# Patient Record
Sex: Male | Born: 1960 | Race: White | Hispanic: No | Marital: Married | Smoking: Current every day smoker
Health system: Southern US, Community
[De-identification: ages and names within clinical notes are randomized; demographics above are authoritative.]

## PROBLEM LIST (undated history)

## (undated) DIAGNOSIS — I10 Essential (primary) hypertension: Secondary | ICD-10-CM

## (undated) DIAGNOSIS — K7460 Unspecified cirrhosis of liver: Secondary | ICD-10-CM

## (undated) HISTORY — PX: TONSILLECTOMY: SUR1361

## (undated) HISTORY — DX: Unspecified cirrhosis of liver: K74.60

## (undated) HISTORY — PX: APPENDECTOMY: SHX54

## (undated) HISTORY — DX: Essential (primary) hypertension: I10

---

## 2006-11-08 ENCOUNTER — Ambulatory Visit: Payer: Self-pay | Admitting: Family Medicine

## 2006-11-08 DIAGNOSIS — R030 Elevated blood-pressure reading, without diagnosis of hypertension: Secondary | ICD-10-CM | POA: Insufficient documentation

## 2006-11-08 DIAGNOSIS — B3789 Other sites of candidiasis: Secondary | ICD-10-CM

## 2006-11-22 ENCOUNTER — Ambulatory Visit: Payer: Self-pay | Admitting: Family Medicine

## 2007-07-01 ENCOUNTER — Ambulatory Visit: Payer: Self-pay | Admitting: Family Medicine

## 2007-12-16 ENCOUNTER — Telehealth: Payer: Self-pay | Admitting: Family Medicine

## 2009-07-09 ENCOUNTER — Ambulatory Visit: Payer: Self-pay | Admitting: Family Medicine

## 2009-07-09 DIAGNOSIS — D179 Benign lipomatous neoplasm, unspecified: Secondary | ICD-10-CM | POA: Insufficient documentation

## 2009-07-13 ENCOUNTER — Encounter: Payer: Self-pay | Admitting: Family Medicine

## 2010-11-01 ENCOUNTER — Ambulatory Visit: Admit: 2010-11-01 | Payer: Self-pay | Admitting: Family Medicine

## 2011-12-19 ENCOUNTER — Emergency Department
Admit: 2011-12-19 | Discharge: 2011-12-19 | Disposition: A | Discharge status: Home / Self Care | Payer: BC Managed Care – PPO

## 2011-12-19 ENCOUNTER — Emergency Department
Admission: EM | Admit: 2011-12-19 | Discharge: 2011-12-19 | Disposition: A | Discharge status: Home Health | Payer: BC Managed Care – PPO | Source: Home / Self Care | Attending: Family Medicine | Admitting: Family Medicine

## 2011-12-19 DIAGNOSIS — R0602 Shortness of breath: Secondary | ICD-10-CM

## 2011-12-19 DIAGNOSIS — J069 Acute upper respiratory infection, unspecified: Secondary | ICD-10-CM

## 2011-12-19 DIAGNOSIS — F17200 Nicotine dependence, unspecified, uncomplicated: Secondary | ICD-10-CM

## 2011-12-19 DIAGNOSIS — R062 Wheezing: Secondary | ICD-10-CM

## 2011-12-19 MED ORDER — BENZONATATE 200 MG PO CAPS: 200.0000 mg | ORAL_CAPSULE | Freq: Every day | ORAL | Status: AC

## 2011-12-19 MED ORDER — AZITHROMYCIN 250 MG PO TABS: ORAL_TABLET | ORAL | Status: AC

## 2011-12-19 NOTE — ED Provider Notes (Signed)
History     CSN: [id number]  Arrival date & time 12/19/11  1826   First MD Initiated Contact with Patient 12/19/11 1910      Chief Complaint  Patient presents with  . Cough      HPI Comments: Patient complains of awakening today with a sore throat, nasal drainage, myalgias, cough, and upper mid-back pain when coughing.  Complains of fatigue.  Cough is non-productive and he complains of shortness of breath with activity.     The history is provided by the patient.    History reviewed. No pertinent past medical history.  History reviewed. No pertinent past surgical history.  Family History:  No pertinent family history.  History  Substance Use Topics  . Smoking status: Current Everyday Smoker  . Smokeless tobacco: Not on file  . Alcohol Use: 14.4 oz/week    24 Cans of beer per week      Review of Systems + sore throat + cough ? pleuritic pain, + upper mid-back pain with cough + wheezing + nasal congestion ? post-nasal drainage No sinus pain/pressure No itchy/red eyes No earache No hemoptysis + SOB No fever, + chills No nausea No vomiting No abdominal pain No diarrhea No urinary symptoms No skin rashes + fatigue + myalgias No headache Used OTC meds without relief  Allergies  Penicillins  Home Medications   Current Outpatient Rx  Name Route Sig Dispense Refill  . AZITHROMYCIN 250 MG PO TABS  Take 2 tabs today; then begin one tab once daily for 4 more days. 6 each 0  . BENZONATATE 200 MG PO CAPS Oral Take 1 capsule (200 mg total) by mouth at bedtime. Take as needed for cough 12 capsule 0    BP 154/97  Pulse 99  Temp(Src) 98.8 F (37.1 C) (Oral)  Resp 18  Ht 5\' 11"  (1.803 m)  Wt 180 lb (81.647 kg)  BMI 25.10 kg/m2  SpO2 96%  Physical Exam Nursing notes and Vital Signs reviewed. Appearance:  Patient appears cachectic, older than stated age, and in no acute distress.  He smells of alcohol but is alert and oriented. Eyes:  Pupils are equal,  round, and reactive to light and accomodation.  Extraocular movement is intact.  Conjunctivae are not inflamed  Ears:  Canals normal.  Tympanic membranes normal.  Nose:  Mildly congested turbinates.  No sinus tenderness.    Pharynx:  Normal Neck:  Supple.  Slightly tender shotty posterior nodes are palpated bilaterally  Lungs:  Bilateral wheezes posteriorly.  Breath sounds are equal but tubular in character.  Heart:  Regular rate and rhythm without murmurs, rubs, or gallops.  Abdomen:  Nontender without masses or hepatosplenomegaly.  Bowel sounds are present.  No CVA or flank tenderness.  Extremities:  No edema.  No calf tenderness Skin:  No rash present.  Right upper back reveals presence of a large nontender lipoma about 4cm dia. Back:  No upper back tenderness to palpation.  ED Course  Procedures none  Labs Reviewed - No data to display Dg Chest 2 View  12/19/2011  *RADIOLOGY REPORT*  Clinical Data: Cough, pleuritic posterior chest pain  CHEST - 2 VIEW  Comparison: None.  Findings:  The examination is minimally degraded secondary to technique (overexposure).  Normal cardiac silhouette.  There is mild prominence of the ascending thoracic aorta. The lungs are hyperinflated.  No focal airspace opacities.  No definite pleural effusion or pneumothorax.  Age indeterminate mild (under 25%) anterior compression deformity of a lower  thoracic/lumbar vertebral bodies.  IMPRESSION: 1.  Hyperexpanded lungs without acute cardiopulmonary disease. 2.  Mild prominence of the ascending thoracic aorta.  Further evaluation with cardiac echo may be obtained if clinically indicated. 3.  Age indeterminate mild compression deformity of lower thoracic/upper lumbar vertebral bodies.  Correlation for point tenderness at these locations is recommended.  Original Report Authenticated By: Waynard Reeds, M.D.   See above note:  No tenderness palpated at site of concern.  Patient states that he has had trauma to his back in  the past.  1. Acute upper respiratory infections of unspecified site       MDM  Suspect early viral URI.  Patient at risk for secondary bacterial infection. Begin Z-pack and Tessalon at bedtime. Take plain Mucinex (guaifenesin) twice daily for cough and congestion.  Increase fluid intake, rest. May use Afrin nasal spray (or generic oxymetazoline) twice daily for about 5 days.  Also recommend using saline nasal spray several times daily and saline nasal irrigation (AYR is a common brand) Stop all antihistamines for now, and other non-prescription cough/cold preparations. May take Ibuprofen 200mg , 4 tabs every 8 hours with food for chest/sternum discomfort. If symptoms become significantly worse during the night proceed to the local emergency room.  Follow-up with family doctor if not improving 7 to 10 days.          Donna Christen, MD 12/20/11 1137

## 2011-12-19 NOTE — Discharge Instructions (Signed)
Take plain Mucinex (guaifenesin) twice daily for cough and congestion.  Increase fluid intake, rest. May use Afrin nasal spray (or generic oxymetazoline) twice daily for about 5 days.  Also recommend using saline nasal spray several times daily and saline nasal irrigation (AYR is a common brand) Stop all antihistamines for now, and other non-prescription cough/cold preparations. May take Ibuprofen 200mg , 4 tabs every 8 hours with food for chest/sternum discomfort. Follow-up with family doctor if not improving 7 to 10 days.

## 2011-12-19 NOTE — ED Notes (Signed)
Pt c/o non productive cough onset this am.  Pt denies fever.  Pt states he has been using ETOH for pain in back. Pt denies injury.  Pt denies N/V/diaphoresis.

## 2011-12-20 ENCOUNTER — Ambulatory Visit: Payer: Self-pay | Admitting: Family Medicine

## 2011-12-20 ENCOUNTER — Telehealth: Payer: Self-pay | Admitting: *Deleted

## 2012-08-29 ENCOUNTER — Ambulatory Visit (INDEPENDENT_AMBULATORY_CARE_PROVIDER_SITE_OTHER): Payer: BC Managed Care – PPO | Admitting: Family Medicine

## 2012-08-29 ENCOUNTER — Encounter: Payer: Self-pay | Admitting: Family Medicine

## 2012-08-29 VITALS — BP 139/80 | HR 80 | Ht 71.0 in | Wt 165.0 lb

## 2012-08-29 DIAGNOSIS — Z23 Encounter for immunization: Secondary | ICD-10-CM

## 2012-08-29 DIAGNOSIS — Z0000 Encounter for general adult medical examination without abnormal findings: Secondary | ICD-10-CM

## 2012-08-29 DIAGNOSIS — L408 Other psoriasis: Secondary | ICD-10-CM

## 2012-08-29 DIAGNOSIS — L409 Psoriasis, unspecified: Secondary | ICD-10-CM

## 2012-08-29 LAB — COMPLETE METABOLIC PANEL WITH GFR
BUN: 4 mg/dL — ABNORMAL LOW (ref 6–23)
CO2: 28 mEq/L (ref 19–32)
Creat: 0.61 mg/dL (ref 0.50–1.35)
GFR, Est African American: >89 mL/min
GFR, Est Non African American: >89 mL/min
Glucose, Bld: 81 mg/dL (ref 70–99)
Total Bilirubin: 0.8 mg/dL (ref 0.3–1.2)
Total Protein: 7.5 g/dL (ref 6.0–8.3)

## 2012-08-29 LAB — LIPID PANEL
Cholesterol: 190 mg/dL (ref 0–200)
HDL: 75 mg/dL (ref >39)
Triglycerides: 57 mg/dL (ref <150)

## 2012-08-29 NOTE — Addendum Note (Signed)
Addended by: Judie Petit A on: 08/29/2012 01:14 PM   Modules accepted: Orders

## 2012-08-29 NOTE — Progress Notes (Signed)
Subjective:    Patient ID: Derek Lopez, male    DOB: [birth year], 51 y.o.   MRN: [id number]  HPI Here for CPE today. No specific complaints. Declines colonscopy but says wil thinkg about it. Declines flu shot.  He also declined to remove his clothing and put on a gown for his exam today. He says the only reason he is here today is because his wife made him come.    Review of Systems comprehensiv eROS is negative.     BP 139/80  Pulse 80  Ht 5\' 11"  (1.803 m)  Wt 165 lb (74.844 kg)  BMI 23.01 kg/m2    Allergies  Allergen Reactions  . Penicillins     REACTION: rash    History reviewed. No pertinent past medical history.  Past Surgical History  Procedure Date  . Appendectomy   . Tonsillectomy     History   Social History  . Marital Status: Single    Spouse Name: N/A    Number of Children: 2  . Years of Education: N/A   Occupational History  . Retired.     Social History Main Topics  . Smoking status: Current Every Day Smoker -- 1.0 packs/day    Types: Cigarettes  . Smokeless tobacco: Not on file  . Alcohol Use: 14.4 oz/week    24 Cans of beer per week  . Drug Use: No  . Sexually Active: Yes -- Male partner(s)   Other Topics Concern  . Not on file   Social History Narrative   No regular exercise but says he is active.      Family History  Problem Relation Age of Onset  . Cancer Mother     No outpatient encounter prescriptions on file as of 08/29/2012.       Objective:   Physical Exam  Constitutional: He is oriented to person, place, and time. He appears well-developed and well-nourished.  HENT:  Head: Normocephalic and atraumatic.  Right Ear: External ear normal.  Left Ear: External ear normal.  Nose: Nose normal.  Mouth/Throat: Oropharynx is clear and moist.  Eyes: Conjunctivae normal and EOM are normal. Pupils are equal, round, and reactive to light.  Neck: Normal range of motion. Neck supple. No thyromegaly present.  Cardiovascular:  Normal rate, regular rhythm, normal heart sounds and intact distal pulses.        No carotid ro abdominal bruits.   Pulmonary/Chest: Effort normal and breath sounds normal.  Abdominal: Soft. Bowel sounds are normal. He exhibits no distension and no mass. There is no tenderness. There is no rebound and no guarding.  Genitourinary:       Declined prostate exam.    Musculoskeletal: Normal range of motion.  Lymphadenopathy:    He has no cervical adenopathy.  Neurological: He is alert and oriented to person, place, and time. He has normal reflexes.  Skin: Skin is warm and dry.       Psoriatic plaques on her lower extremities.   Psychiatric: He has a normal mood and affect. His behavior is normal. Judgment and thought content normal.          Assessment & Plan:  CPE Keep up a regular exercise program and make sure you are eating a healthy diet Try to eat 4 servings of dairy a day, or if you are lactose intolerant take a calcium with vitamin D daily.  Your vaccines are up to date.  Due for CMP, lipids, PSA  He declined colon cancer screening but did  say he would at least do stool cards. He said he would think about the colonoscopy. Given stool cards samples today.  Tdap given today. He declined a flu vaccine.  Blood pressure is borderline. We'll keep an eye on this.

## 2012-08-29 NOTE — Patient Instructions (Addendum)
Keep up a regular exercise program and make sure you are eating a healthy diet Try to eat 4 servings of dairy a day, or if you are lactose intolerant take a calcium with vitamin D daily.  Your vaccines are up to date.  Please think about getting her colonoscopy.

## 2012-09-02 NOTE — Addendum Note (Signed)
Addended by: Ellsworth Lennox on: 09/02/2012 11:21 AM   Modules accepted: Orders

## 2012-09-03 LAB — HEPATITIS PANEL, ACUTE
HCV Ab: NEGATIVE
Hepatitis B Surface Ag: NEGATIVE

## 2013-08-06 ENCOUNTER — Ambulatory Visit: Payer: BC Managed Care – PPO | Admitting: Family Medicine

## 2013-10-28 ENCOUNTER — Encounter: Payer: Self-pay | Admitting: Family Medicine

## 2013-10-28 ENCOUNTER — Ambulatory Visit (INDEPENDENT_AMBULATORY_CARE_PROVIDER_SITE_OTHER): Payer: 59 | Admitting: Family Medicine

## 2013-10-28 VITALS — BP 137/85 | HR 89 | Temp 97.4°F | Wt 157.0 lb

## 2013-10-28 DIAGNOSIS — L089 Local infection of the skin and subcutaneous tissue, unspecified: Secondary | ICD-10-CM

## 2013-10-28 DIAGNOSIS — L723 Sebaceous cyst: Secondary | ICD-10-CM

## 2013-10-28 MED ORDER — SULFAMETHOXAZOLE-TMP DS 800-160 MG PO TABS: 1.0000 | ORAL_TABLET | Freq: Two times a day (BID) | ORAL | Status: DC

## 2013-10-28 NOTE — Progress Notes (Signed)
   Subjective:    Patient ID: Derek Lopez, male    DOB: [birth year], 53 y.o.   MRN: [id number]  HPI  Infected sebaceous cyst - he says it has been there for years. Here is having it as a child. He said this is the third time over his lifetime that it's become infected and burst. It opened up on Saturday and started draining a large amount of pus. He denies any fevers chills or sweats. His wife says she's been putting gauze on it and it still continued to drain. They actually are to have this appointment scheduled.   Review of Systems     Objective:   Physical Exam  Constitutional: He appears well-developed and well-nourished.  HENT:  Head: Normocephalic and atraumatic.  Skin: Skin is warm and dry.  Right mid back with approx 6-7 cm when probed.  approx 1 cm surrounding erythema at the opening.   Psychiatric: He has a normal mood and affect. His behavior is normal.          Assessment & Plan:  Infected sebaceous cyst-area was numbed and opened up further to improve drainage. Will place him on Keflex as he does have an approximately 1 cm ring of erythema. Will schedule with dermatology for further excision. Because the cyst itself is approximately 6-7 cm large I would feel more comfortable if dermatology or Gen. surgery actually removed the cyst.  Incision and Drainage Procedure Note  Pre-operative Diagnosis: infected seb cyst  Post-operative Diagnosis: same  Indications: infection and pain  Anesthesia: 1% lidocaine with epinephrine  Procedure Details  The procedure, risks and complications have been discussed in detail (including, but not limited to airway compromise, infection, bleeding) with the patient, and the patient has signed consent to the procedure.  The skin was sterilely prepped and draped over the affected area in the usual fashion. After adequate local anesthesia, I&D with a #11 blade was performed on the cyst . Purulent drainage: present. Wound was probed  with sterile applicator. Lesion approx 6- 7 cm in size. No tracks.  The patient was observed until stable.  Findings: pus  EBL: 0.5 cc's  Drains: gauze placed  Condition: Tolerated procedure well   Complications: none.

## 2014-09-24 ENCOUNTER — Encounter: Payer: Self-pay | Admitting: Family Medicine

## 2014-09-24 ENCOUNTER — Ambulatory Visit (INDEPENDENT_AMBULATORY_CARE_PROVIDER_SITE_OTHER): Payer: 59 | Admitting: Family Medicine

## 2014-09-24 ENCOUNTER — Ambulatory Visit (INDEPENDENT_AMBULATORY_CARE_PROVIDER_SITE_OTHER): Payer: 59 | Admitting: *Deleted

## 2014-09-24 VITALS — BP 145/92 | HR 92 | Temp 97.7°F | Wt 164.0 lb

## 2014-09-24 DIAGNOSIS — Z23 Encounter for immunization: Secondary | ICD-10-CM

## 2014-09-24 DIAGNOSIS — G5701 Lesion of sciatic nerve, right lower limb: Secondary | ICD-10-CM

## 2014-09-24 DIAGNOSIS — Z0000 Encounter for general adult medical examination without abnormal findings: Secondary | ICD-10-CM

## 2014-09-24 DIAGNOSIS — F1090 Alcohol use, unspecified, uncomplicated: Secondary | ICD-10-CM

## 2014-09-24 DIAGNOSIS — Z789 Other specified health status: Secondary | ICD-10-CM

## 2014-09-24 NOTE — Patient Instructions (Signed)
complete physical examination Keep up a regular exercise program and make sure you are eating a healthy diet Try to eat 4 servings of dairy a day, or if you are lactose intolerant take a calcium with vitamin D daily.  Your vaccines are up to date.   

## 2014-09-24 NOTE — Progress Notes (Signed)
   Subjective:    Patient ID: Derek Lopez, male    DOB: 1961-02-10, 53 y.o.   MRN: [id number]  HPI  Here for CPE.  No urinary sxs.  No real exercise but says walks a lot.  Heavy alcohol use.    Pain in the right posterior hip. Has been going on for years.  Say had fallen on that hip about 3 times over the last 3 years.  Occ has pain that radiates down the right leg all the way to his foot. Sometimes it causes him to limp and sometimes has difficulty walking to the grocery store with it. At other times it seems better. He denies any low back pain or injury.  He says he drinks 8-10 beers per day.  His wife is here with him today.   Review of Systems     Objective:   Physical Exam  Constitutional: He is oriented to person, place, and time. He appears well-developed and well-nourished.  HENT:  Head: Normocephalic and atraumatic.  Right Ear: External ear normal.  Left Ear: External ear normal.  Nose: Nose normal.  Mouth/Throat: Oropharynx is clear and moist.  TMs and canals are clear.   Eyes: Conjunctivae and EOM are normal. Pupils are equal, round, and reactive to light.  Neck: Normal range of motion. Neck supple. No thyromegaly present.  Cardiovascular: Normal rate, regular rhythm, normal heart sounds and intact distal pulses.   Pulmonary/Chest: Effort normal and breath sounds normal.  Abdominal: Soft. Bowel sounds are normal. He exhibits no distension and no mass. There is no tenderness. There is no rebound and no guarding.  Musculoskeletal: Normal range of motion.  Lymphadenopathy:    He has no cervical adenopathy.  Neurological: He is alert and oriented to person, place, and time. He has normal reflexes.  Skin: Skin is warm and dry.  Psychiatric: He has a normal mood and affect. His behavior is normal. Judgment and thought content normal.    Nontender over the lumbar spine. Right Hip, knee, ankle strength and range of motion is normal and symmetric. He's tender over the mid  back area. Nontender over the SI joint. No significant discomfort pain with right hip internal or external rotation.      Assessment & Plan:  CPE -  Try to eat 4 servings of dairy a day, or if you are lactose intolerant take a calcium with vitamin D daily.  Your vaccines are up to date.  Discussed need for colonoscopy. Flu shot given today. Encouraged him to check with his insurance to see if they have coverage for the shingles vaccine for 50 and over.  Right piriformis syndrome - discussed dx. Work on stretches and exercise and call if not better in one month.    Heavy alcohol use - Discourage drinking.  Encourage him to at least cut back.

## 2014-09-25 LAB — LIPID PANEL
CHOL/HDL RATIO: 2.8 ratio
CHOLESTEROL: 215 mg/dL — AB (ref 0–200)
HDL: 76 mg/dL (ref >39)
LDL Cholesterol: 121 mg/dL — ABNORMAL HIGH (ref 0–99)
Triglycerides: 89 mg/dL (ref <150)
VLDL: 18 mg/dL (ref 0–40)

## 2014-09-25 LAB — COMPLETE METABOLIC PANEL WITH GFR
ALBUMIN: 4.5 g/dL (ref 3.5–5.2)
ALK PHOS: 114 U/L (ref 39–117)
ALT: 38 U/L (ref 0–53)
AST: 84 U/L — ABNORMAL HIGH (ref 0–37)
BILIRUBIN TOTAL: 1.1 mg/dL (ref 0.2–1.2)
BUN: 4 mg/dL — AB (ref 6–23)
CO2: 27 meq/L (ref 19–32)
Calcium: 9.3 mg/dL (ref 8.4–10.5)
Chloride: 91 mEq/L — ABNORMAL LOW (ref 96–112)
Creat: 0.58 mg/dL (ref 0.50–1.35)
GLUCOSE: 103 mg/dL — AB (ref 70–99)
POTASSIUM: 4.2 meq/L (ref 3.5–5.3)
Sodium: 128 mEq/L — ABNORMAL LOW (ref 135–145)
TOTAL PROTEIN: 8.1 g/dL (ref 6.0–8.3)

## 2014-09-25 LAB — PSA: PSA: 3.62 ng/mL (ref <=4.00)

## 2014-09-28 ENCOUNTER — Other Ambulatory Visit: Payer: Self-pay | Admitting: Family Medicine

## 2014-09-28 DIAGNOSIS — R748 Abnormal levels of other serum enzymes: Secondary | ICD-10-CM

## 2014-09-28 DIAGNOSIS — Z789 Other specified health status: Secondary | ICD-10-CM

## 2014-09-28 DIAGNOSIS — E871 Hypo-osmolality and hyponatremia: Secondary | ICD-10-CM

## 2014-09-29 ENCOUNTER — Other Ambulatory Visit: Payer: Self-pay | Admitting: *Deleted

## 2014-09-29 ENCOUNTER — Encounter: Payer: Self-pay | Admitting: *Deleted

## 2014-09-29 DIAGNOSIS — E871 Hypo-osmolality and hyponatremia: Secondary | ICD-10-CM

## 2014-09-30 ENCOUNTER — Telehealth: Payer: Self-pay | Admitting: *Deleted

## 2014-09-30 NOTE — Telephone Encounter (Signed)
Spoke w/pt's wife and informed her of pt needing to have a CXR done and [place] of liver. She stated that she will bring him in by the end of the week. I also told her that someone should be contacting them w/regards to the [place].Elouise Munroe'

## 2014-10-01 ENCOUNTER — Ambulatory Visit (INDEPENDENT_AMBULATORY_CARE_PROVIDER_SITE_OTHER): Payer: 59

## 2014-10-01 ENCOUNTER — Encounter: Payer: Self-pay | Admitting: Family Medicine

## 2014-10-01 DIAGNOSIS — I700 Atherosclerosis of aorta: Secondary | ICD-10-CM | POA: Insufficient documentation

## 2014-10-01 DIAGNOSIS — Z87891 Personal history of nicotine dependence: Secondary | ICD-10-CM

## 2014-10-01 DIAGNOSIS — E871 Hypo-osmolality and hyponatremia: Secondary | ICD-10-CM

## 2014-10-02 ENCOUNTER — Ambulatory Visit (INDEPENDENT_AMBULATORY_CARE_PROVIDER_SITE_OTHER): Payer: 59

## 2014-10-02 ENCOUNTER — Other Ambulatory Visit: Payer: Self-pay | Admitting: Family Medicine

## 2014-10-02 DIAGNOSIS — K7030 Alcoholic cirrhosis of liver without ascites: Secondary | ICD-10-CM

## 2014-10-02 DIAGNOSIS — Z789 Other specified health status: Secondary | ICD-10-CM

## 2014-10-02 DIAGNOSIS — F1099 Alcohol use, unspecified with unspecified alcohol-induced disorder: Secondary | ICD-10-CM

## 2014-10-02 DIAGNOSIS — R748 Abnormal levels of other serum enzymes: Secondary | ICD-10-CM

## 2014-10-05 ENCOUNTER — Other Ambulatory Visit: Payer: Self-pay | Admitting: *Deleted

## 2014-10-05 DIAGNOSIS — E871 Hypo-osmolality and hyponatremia: Secondary | ICD-10-CM

## 2014-11-04 ENCOUNTER — Telehealth: Payer: Self-pay | Admitting: *Deleted

## 2014-11-04 DIAGNOSIS — K869 Disease of pancreas, unspecified: Secondary | ICD-10-CM

## 2014-11-04 NOTE — Telephone Encounter (Signed)
Pt's wife informed that he will have to be seen by this specialist no one in [place]. Also is ok with having ct done.Derek Lopez

## 2014-11-04 NOTE — Telephone Encounter (Signed)
Pt's wife called and stated that he received a letter in the mail from [place] [facility] in [place]. She said that she would like get this appt scheduled for [facility] if possible.

## 2014-11-05 NOTE — Telephone Encounter (Signed)
Order placed. Couldn't find one specific for pancreas.

## 2014-11-09 ENCOUNTER — Telehealth: Payer: Self-pay | Admitting: *Deleted

## 2014-11-09 ENCOUNTER — Other Ambulatory Visit: Payer: Self-pay | Admitting: Family Medicine

## 2014-11-09 ENCOUNTER — Telehealth: Payer: Self-pay

## 2014-11-09 DIAGNOSIS — K869 Disease of pancreas, unspecified: Secondary | ICD-10-CM

## 2014-11-09 NOTE — Telephone Encounter (Signed)
No PA is required for CT of abdomen with and without contrast.

## 2014-11-09 NOTE — Progress Notes (Signed)
Imaging called and CT needs to be changed to with and without contrast. New order entered for follow-up lesion of the pancreas.

## 2014-11-09 NOTE — Telephone Encounter (Signed)
Pt's wife informed that CT will be done here in [place].Audelia Hives [place]

## 2014-12-16 ENCOUNTER — Telehealth: Payer: Self-pay | Admitting: Family Medicine

## 2014-12-16 NOTE — Telephone Encounter (Signed)
Call pt: received notes from hepatologist adn they want [place] to further evaluation for his low sodium If he is ok , we usually do extra labs for this. Let me know.

## 2014-12-17 NOTE — Telephone Encounter (Signed)
Pt's wife informed and is ok with this .Derek Lopez

## 2014-12-22 NOTE — Telephone Encounter (Signed)
Blood work was ordered in December. He can go anytime

## 2015-02-25 ENCOUNTER — Other Ambulatory Visit: Payer: Self-pay | Admitting: Nurse Practitioner

## 2015-02-25 DIAGNOSIS — C220 Liver cell carcinoma: Secondary | ICD-10-CM

## 2015-02-26 ENCOUNTER — Encounter: Payer: Self-pay | Admitting: Physician Assistant

## 2015-02-26 ENCOUNTER — Ambulatory Visit (INDEPENDENT_AMBULATORY_CARE_PROVIDER_SITE_OTHER): Payer: 59 | Admitting: Physician Assistant

## 2015-02-26 VITALS — BP 140/100 | HR 96 | Wt 170.0 lb

## 2015-02-26 DIAGNOSIS — Z789 Other specified health status: Secondary | ICD-10-CM | POA: Diagnosis not present

## 2015-02-26 DIAGNOSIS — R000 Tachycardia, unspecified: Secondary | ICD-10-CM

## 2015-02-26 DIAGNOSIS — F1090 Alcohol use, unspecified, uncomplicated: Secondary | ICD-10-CM

## 2015-02-26 DIAGNOSIS — I10 Essential (primary) hypertension: Secondary | ICD-10-CM

## 2015-02-26 DIAGNOSIS — E871 Hypo-osmolality and hyponatremia: Secondary | ICD-10-CM | POA: Diagnosis not present

## 2015-02-26 DIAGNOSIS — IMO0001 Reserved for inherently not codable concepts without codable children: Secondary | ICD-10-CM | POA: Insufficient documentation

## 2015-02-26 DIAGNOSIS — F1020 Alcohol dependence, uncomplicated: Secondary | ICD-10-CM | POA: Insufficient documentation

## 2015-02-26 MED ORDER — METOPROLOL SUCCINATE ER 50 MG PO TB24: 50.0000 mg | ORAL_TABLET | Freq: Every day | ORAL | Status: DC

## 2015-02-26 NOTE — Progress Notes (Signed)
   Subjective:    Patient ID: Derek Lopez, male    DOB: 05-Jul-1961, 54 y.o.   MRN: [id number]  HPI Pt presents to the clinic with wife for elevated blood pressure readings. Patient admits to a history of smoking and drinking alcohol daily. He drinks about 8-10 beers a day. Dr. Charise Carwin had sent him to a gastroenterologist to follow his liver enzymes due to alcohol use. His blood pressures at his gastroenterologists have been elevated as well as when his wife checks his blood pressures they've been in the 150s-160s/90s-100. He denies any chest pain, palpitations, vision changes. He does have some headaches. He also does have some shortness of breath with exertion. He has never been on anything for blood pressure. His last labs his sodium was decreased. He did have a chest x-ray that was negative for any malignancy. He has had no other blood work done.  Review of Systems  All other systems reviewed and are negative.      Objective:   Physical Exam  Constitutional: He is oriented to person, place, and time. He appears well-developed and well-nourished.  HENT:  Head: Normocephalic and atraumatic.  Cardiovascular: Normal rate, regular rhythm and normal heart sounds.   Pulmonary/Chest: Effort normal and breath sounds normal.  Neurological: He is alert and oriented to person, place, and time.  Skin: Skin is dry.  Psychiatric: He has a normal mood and affect. His behavior is normal.          Assessment & Plan:  Hypertension/tachycardia- ACE and ARB to use in caution with hyponatermia; therefore, I did not want to prescribe that even though first-line therapy. Norvasc caution with aortic artherosclerosis I do see this on patient's med list.. Will try metoprolol 50 mg once daily. Discussed side effects of beta blockers. Encouraged patient to follow-up in the next 2-3 weeks with his PCP Dr. Charise Carwin.  Hyponatremia-labs were ordered to further evaluate hyponatremia. Certainly he was not on any  medications that should be causing this. Chest x-ray was normal. Will check a random glucose as well.. I did inadvertently forget to order a thyroid as well. If not been checked will add when labs come back.

## 2015-02-27 LAB — OSMOLALITY: Osmolality: 316 mOsm/kg — ABNORMAL HIGH (ref 275–300)

## 2015-02-27 LAB — GLUCOSE, RANDOM: GLUCOSE: 108 mg/dL — AB (ref 70–99)

## 2015-02-27 LAB — SODIUM, URINE, RANDOM: Sodium, Ur: 29 mEq/L

## 2015-02-27 LAB — OSMOLALITY, URINE: Osmolality, Ur: 217 mOsm/kg — ABNORMAL LOW (ref 390–1090)

## 2015-03-03 ENCOUNTER — Other Ambulatory Visit: Payer: Self-pay | Admitting: Family Medicine

## 2015-03-03 DIAGNOSIS — E871 Hypo-osmolality and hyponatremia: Secondary | ICD-10-CM

## 2015-03-23 ENCOUNTER — Other Ambulatory Visit: Payer: Self-pay | Admitting: Family Medicine

## 2015-03-23 MED ORDER — METOPROLOL SUCCINATE ER 50 MG PO TB24: 50.0000 mg | ORAL_TABLET | Freq: Every day | ORAL | Status: DC

## 2015-04-01 ENCOUNTER — Ambulatory Visit (INDEPENDENT_AMBULATORY_CARE_PROVIDER_SITE_OTHER): Payer: 59 | Admitting: Family Medicine

## 2015-04-01 ENCOUNTER — Encounter: Payer: Self-pay | Admitting: Family Medicine

## 2015-04-01 VITALS — BP 122/84 | HR 88 | Wt 166.0 lb

## 2015-04-01 DIAGNOSIS — R062 Wheezing: Secondary | ICD-10-CM

## 2015-04-01 DIAGNOSIS — I10 Essential (primary) hypertension: Secondary | ICD-10-CM | POA: Diagnosis not present

## 2015-04-01 DIAGNOSIS — E871 Hypo-osmolality and hyponatremia: Secondary | ICD-10-CM

## 2015-04-01 MED ORDER — METOPROLOL SUCCINATE ER 50 MG PO TB24: 50.0000 mg | ORAL_TABLET | Freq: Two times a day (BID) | ORAL | Status: DC

## 2015-04-01 MED ORDER — ALBUTEROL SULFATE (2.5 MG/3ML) 0.083% IN NEBU
2.5000 mg | INHALATION_SOLUTION | Freq: Once | RESPIRATORY_TRACT | Status: AC
  Administered 2015-04-01: 2.5 mg via RESPIRATORY_TRACT

## 2015-04-01 MED ORDER — IPRATROPIUM-ALBUTEROL 0.5-2.5 (3) MG/3ML IN SOLN
3.0000 mL | Freq: Once | RESPIRATORY_TRACT | Status: AC
  Administered 2015-04-01: 3 mL via RESPIRATORY_TRACT

## 2015-04-01 NOTE — Progress Notes (Signed)
   Subjective:    Patient ID: Derek Lopez, male    DOB: 27-Jun-1961, 54 y.o.   MRN: [id number]  HPI Hypertension- Pt denies chest pain, SOB, dizziness, or heart palpitations.  Taking meds as directed w/o problems.  Denies medication side effects.  He also brought in a log of blood pressures area over the last 3 weeks. The best blood pressure was 125/90. The highest was 168/105.    He denies any SOB or chest pain  His wife has questions about the low sodium and the labs we found.     Review of Systems     Objective:   Physical Exam  Constitutional: He is oriented to person, place, and time. He appears well-developed and well-nourished.  HENT:  Head: Normocephalic and atraumatic.  Cardiovascular: Normal rate, regular rhythm and normal heart sounds.   Pulmonary/Chest: Effort normal. He has wheezes.  Diffuse expiratory wheezing. No in [place] wheezing.  Neurological: He is alert and oriented to person, place, and time.  Skin: Skin is warm and dry.  Psychiatric: He has a normal mood and affect. His behavior is normal.          Assessment & Plan:  HTN - he is tolerating the beta blocker well without any side effects so far. Will increase dose to 100 mg. New per prescription and sent to pharmacy. Follow-up in one month. Continue to log blood pressures at home and bring the record in with him.  Wheezing-peak flows performed. He says he does have allergies. He denies any history of asthma as a child, but was on allergy shots for quite some time. Peak flow in the red zone. We gave nebulizer treatment. Repeat still in the red zone. Second nebulizer treatment given. Repeat did make some improvement but still technically in the red zone. Pulse ox is 97%.F/U for spirometry.  He says he feels fine today.   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 We discussed this further today and they  are okay with doing so. Hyponatremia.

## 2015-04-20 ENCOUNTER — Encounter: Payer: Self-pay | Admitting: Nurse Practitioner

## 2015-04-30 ENCOUNTER — Ambulatory Visit (INDEPENDENT_AMBULATORY_CARE_PROVIDER_SITE_OTHER): Payer: 59 | Admitting: Family Medicine

## 2015-04-30 ENCOUNTER — Encounter: Payer: Self-pay | Admitting: Family Medicine

## 2015-04-30 VITALS — BP 141/84 | HR 73 | Ht 71.0 in | Wt 168.0 lb

## 2015-04-30 DIAGNOSIS — F17200 Nicotine dependence, unspecified, uncomplicated: Secondary | ICD-10-CM

## 2015-04-30 DIAGNOSIS — J4540 Moderate persistent asthma, uncomplicated: Secondary | ICD-10-CM

## 2015-04-30 DIAGNOSIS — Z720 Tobacco use: Secondary | ICD-10-CM | POA: Diagnosis not present

## 2015-04-30 DIAGNOSIS — R062 Wheezing: Secondary | ICD-10-CM

## 2015-04-30 DIAGNOSIS — J410 Simple chronic bronchitis: Secondary | ICD-10-CM

## 2015-04-30 DIAGNOSIS — J45909 Unspecified asthma, uncomplicated: Secondary | ICD-10-CM | POA: Insufficient documentation

## 2015-04-30 MED ORDER — ALBUTEROL SULFATE HFA 108 (90 BASE) MCG/ACT IN AERS: 2.0000 | INHALATION_SPRAY | Freq: Four times a day (QID) | RESPIRATORY_TRACT | Status: DC | PRN

## 2015-04-30 MED ORDER — ALBUTEROL SULFATE (2.5 MG/3ML) 0.083% IN NEBU
2.5000 mg | INHALATION_SOLUTION | Freq: Once | RESPIRATORY_TRACT | Status: AC
  Administered 2015-04-30: 2.5 mg via RESPIRATORY_TRACT

## 2015-04-30 MED ORDER — FLUTICASONE FUROATE-VILANTEROL 100-25 MCG/INH IN AEPB: 1.0000 | INHALATION_SPRAY | Freq: Every day | RESPIRATORY_TRACT | Status: DC

## 2015-04-30 NOTE — Progress Notes (Signed)
   Subjective:    Patient ID: Derek Lopez, male    DOB: [birth year], 54 y.o.   MRN: [id number]  HPI Had allergies his whole life. Says never had asthma as a child.  Worked in [employer]. Worked in a shipyard for about 3-4 years.  He has been a smoker most of his life.  Started wheezing intermittantly for months to years.   Currently smokes about 1 PPD.     Review of Systems     Objective:   Physical Exam  Constitutional: He is oriented to person, place, and time. He appears well-developed and well-nourished.  HENT:  Head: Normocephalic and atraumatic.  Cardiovascular: Normal rate, regular rhythm and normal heart sounds.   Pulmonary/Chest: Effort normal and breath sounds normal.  Neurological: He is alert and oriented to person, place, and time.  Skin: Skin is warm and dry.  Psychiatric: He has a normal mood and affect. His behavior is normal.          Assessment & Plan:  COPD/Asthma - New dx. Reviewed his spirometry today.  Will start Breo. Showed him how to use the device.  Given rescue inhaler as well.  F/u in 3 months.   Spirometry showed FVC of 85%, FEV1 of 47% and ratio of 44%. He had significant improvement in FEV1 of 36% and an increase in the FEF 25-75% small airways of 65%. Diagnosis most consistent with COPD and asthma.

## 2015-05-14 ENCOUNTER — Ambulatory Visit (INDEPENDENT_AMBULATORY_CARE_PROVIDER_SITE_OTHER): Payer: 59

## 2015-05-14 ENCOUNTER — Encounter: Payer: Self-pay | Admitting: Family Medicine

## 2015-05-14 ENCOUNTER — Ambulatory Visit (INDEPENDENT_AMBULATORY_CARE_PROVIDER_SITE_OTHER): Payer: 59 | Admitting: Family Medicine

## 2015-05-14 VITALS — BP 134/89 | HR 59 | Wt 167.0 lb

## 2015-05-14 DIAGNOSIS — M545 Low back pain, unspecified: Secondary | ICD-10-CM

## 2015-05-14 DIAGNOSIS — I700 Atherosclerosis of aorta: Secondary | ICD-10-CM | POA: Diagnosis not present

## 2015-05-14 DIAGNOSIS — M438X8 Other specified deforming dorsopathies, sacral and sacrococcygeal region: Secondary | ICD-10-CM | POA: Diagnosis not present

## 2015-05-14 NOTE — Progress Notes (Signed)
Quick Note:  Xray shows lots of arthritis. ______

## 2015-05-14 NOTE — Assessment & Plan Note (Signed)
Patient has pain predominantly in his right SI area. This is chronic now for one year. Plan to obtain lumbar and sacral x-rays to assess degree of degenerative disease. We'll also sent for physical therapy. If not better in 4 weeks will proceed with further workup possibly including ultrasound-guided SI injection versus MRI.

## 2015-05-14 NOTE — Patient Instructions (Signed)
Thank you for coming in today. We will do xrays today.  Go to PT.  Return in 4 weeks.   Lumbosacral Strain Lumbosacral strain is a strain of any of the parts that make up your lumbosacral vertebrae. Your lumbosacral vertebrae are the bones that make up the lower third of your backbone. Your lumbosacral vertebrae are held together by muscles and tough, fibrous tissue (ligaments).  CAUSES  A sudden blow to your back can cause lumbosacral strain. Also, anything that causes an excessive stretch of the muscles in the low back can cause this strain. This is typically seen when people exert themselves strenuously, fall, lift heavy objects, bend, or crouch repeatedly. RISK FACTORS  Physically demanding work.  Participation in pushing or pulling sports or sports that require a sudden twist of the back (tennis, golf, baseball).  Weight lifting.  Excessive lower back curvature.  Forward-tilted pelvis.  Weak back or abdominal muscles or both.  Tight hamstrings. SIGNS AND SYMPTOMS  Lumbosacral strain may cause pain in the area of your injury or pain that moves (radiates) down your leg.  DIAGNOSIS Your health care provider can often diagnose lumbosacral strain through a physical exam. In some cases, you may need tests such as X-ray exams.  TREATMENT  Treatment for your lower back injury depends on many factors that your clinician will have to evaluate. However, most treatment will include the use of anti-inflammatory medicines. HOME CARE INSTRUCTIONS   Avoid hard physical activities (tennis, racquetball, waterskiing) if you are not in proper physical condition for it. This may aggravate or create problems.  If you have a back problem, avoid sports requiring sudden body movements. Swimming and walking are generally safer activities.  Maintain good posture.  Maintain a healthy weight.  For acute conditions, you may put ice on the injured area.  Put ice in a plastic bag.  Place a towel  between your skin and the bag.  Leave the ice on for 20 minutes, 2-3 times a day.  When the low back starts healing, stretching and strengthening exercises may be recommended. SEEK MEDICAL CARE IF:  Your back pain is getting worse.  You experience severe back pain not relieved with medicines. SEEK IMMEDIATE MEDICAL CARE IF:   You have numbness, tingling, weakness, or problems with the use of your arms or legs.  There is a change in bowel or bladder control.  You have increasing pain in any area of the body, including your belly (abdomen).  You notice shortness of breath, dizziness, or feel faint.  You feel sick to your stomach (nauseous), are throwing up (vomiting), or become sweaty.  You notice discoloration of your toes or legs, or your feet get very cold. MAKE SURE YOU:   Understand these instructions.  Will watch your condition.  Will get help right away if you are not doing well or get worse. Document Released: 07/19/2005 Document Revised: 10/14/2013 Document Reviewed: 05/28/2013 [facility] Patient Information 2015 [place], [place]. This information is not intended to replace advice given to you by your health care provider. Make sure you discuss any questions you have with your health care provider.

## 2015-05-14 NOTE — Progress Notes (Signed)
   Subjective:    I'm seeing this patient as a consultation for:  Dr. Madilyn Fireman  CC: Back pain  HPI: Patient has a one-year history of right low back pain. The pain radiates to the buttock but not the lower leg. No weakness or numbness bowel bladder dysfunction. No recent injury fevers chills nausea vomiting or diarrhea. Ibuprofen helps his pain. Pain is worse with activity. Pain limits activity as well.  Past medical history, Surgical history, Family history not pertinant except as noted below, Social history, Allergies, and medications have been entered into the medical record, reviewed, and no changes needed.   Review of Systems: No headache, visual changes, nausea, vomiting, diarrhea, constipation, dizziness, abdominal pain, skin rash, fevers, chills, night sweats, weight loss, swollen lymph nodes, body aches, joint swelling, muscle aches, chest pain, shortness of breath, mood changes, visual or auditory hallucinations.   Objective:  Filed Vitals:   05/14/15 1137  BP: 134/89  Pulse: 59     General: Well Developed, well nourished, and in no acute distress. Appears older than stated age Neuro/Psych: Alert and oriented x3, extra-ocular muscles intact, able to move all 4 extremities, sensation grossly intact. Skin: Warm and dry, no rashes noted.  Respiratory: Not using accessory muscles, speaking in full sentences, trachea midline.  Cardiovascular: Pulses palpable, no extremity edema. Abdomen: Does not appear distended. MSK: Back: Nontender to midline. Tender palpation right SI joint. Normal back range of motion. Negative Faber test and pretzel stretch bilaterally. Negative straight leg raise test bilaterally. Lower extremity strength reflexes and sensation are intact. Mild antalgic gait. Hips bilaterally are nontender with normal motion.   No results found for this or any previous visit (from the past 24 hour(s)). No results found.  Impression and Recommendations:   This case  required medical decision making of moderate complexity.

## 2015-05-25 ENCOUNTER — Ambulatory Visit (INDEPENDENT_AMBULATORY_CARE_PROVIDER_SITE_OTHER): Payer: 59 | Admitting: Rehabilitative and Restorative Service Providers"

## 2015-05-25 ENCOUNTER — Encounter: Payer: Self-pay | Admitting: Rehabilitative and Restorative Service Providers"

## 2015-05-25 DIAGNOSIS — Z7409 Other reduced mobility: Secondary | ICD-10-CM | POA: Diagnosis not present

## 2015-05-25 DIAGNOSIS — M545 Low back pain, unspecified: Secondary | ICD-10-CM

## 2015-05-25 DIAGNOSIS — R29898 Other symptoms and signs involving the musculoskeletal system: Secondary | ICD-10-CM

## 2015-05-25 DIAGNOSIS — M2560 Stiffness of unspecified joint, not elsewhere classified: Secondary | ICD-10-CM | POA: Diagnosis not present

## 2015-05-25 DIAGNOSIS — M7989 Other specified soft tissue disorders: Secondary | ICD-10-CM

## 2015-05-25 DIAGNOSIS — R531 Weakness: Secondary | ICD-10-CM

## 2015-05-25 DIAGNOSIS — M79604 Pain in right leg: Secondary | ICD-10-CM

## 2015-05-25 NOTE — Therapy (Signed)
[facility] [place], [place], [address] Phone: [phone]   Fax:  [phone]  Physical Therapy Evaluation  Patient Details  Name: Derek Lopez MRN: [id number] Date of Birth: [birth year] Referring Provider:  Gregor Hams, MD  Encounter Date: 05/25/2015      PT End of Session - 05/25/15 1241    Visit Number 1   Number of Visits 16   Date for PT Re-Evaluation 07/20/15   PT Start Time 3532   PT Stop Time 9924   PT Time Calculation (min) 63 min   Activity Tolerance Patient tolerated treatment well      History reviewed. No pertinent past medical history.  Past Surgical History  Procedure Laterality Date  . Appendectomy    . Tonsillectomy      There were no vitals filed for this visit.  Visit Diagnosis:  LBP radiating to right leg - Plan: PT plan of care cert/re-cert  Stiffness in joint - Plan: PT plan of care cert/re-cert  Weakness of both lower extremities - Plan: PT plan of care cert/re-cert  Decreased strength, endurance, and mobility - Plan: PT plan of care cert/re-cert  Pain and swelling of right lower extremity - Plan: PT plan of care cert/re-cert      Subjective Assessment - 05/25/15 1204    Subjective Patient reports that he has been working in [employer] al his working life. He has had back injuries numerous times in the past. Increased paiin in the past 2 months with no known cause. Seen by MD diagnosed with "pinched nerve" and referred to PT   Pertinent History Asthma; HTN; history of LBP   How long can you sit comfortably? 30 min   How long can you stand comfortably? 15 min   How long can you walk comfortably? uses cane or holds wifes shoulder 15 min   Diagnostic tests xrays arthritis   Patient Stated Goals get rid of some of the pain    Currently in Pain? Yes   Pain Score 5    Pain Location Back   Pain Orientation Right   Pain Type Chronic pain   Pain Radiating Towards  towards right hip and into right leg   Pain Onset More than a month ago   Pain Frequency Constant   Aggravating Factors  moving   Pain Relieving Factors sitting   Effect of Pain on Daily Activities decreased daily activity level            [facility] PT Assessment - 05/25/15 0001    Assessment   Medical Diagnosis Rt LBP   Onset Date/Surgical Date 04/08/15   Hand Dominance Right   Next MD Visit 08/16   Precautions   Precaution Comments uses a cane or works with hand on his wife's shoulder   Balance Screen   Has the patient fallen in the past 6 months No   Has the patient had a decrease in activity level because of a fear of falling?  No   Is the patient reluctant to leave their home because of a fear of falling?  No   Home Environment   Additional Comments lives in two story home platform to enter  down 7 steps for bedroom rail on Rt descending steps   Prior Function   Level of Independence Independent with basic ADLs   Vocation Retired   Leisure yard work/household chores   Observation/Other Assessments   Focus on Therapeutic Outcomes (FOTO)  46% limitation  Sensation   Additional Comments WFL's per patient report   Posture/Postural Control   Posture Comments head forward shoulders rounded and elevated head of the humerus anterior in orientation; decreased lumbar lordosis; LE's abducted flexed at hips and knees   AROM   Right Hip Extension -10   Right Hip Flexion 125   Right Hip ABduction 35   Left Hip Extension -5   Left Hip Flexion 140   Left Hip ABduction 45   Lumbar Flexion 60%   Lumbar Extension 30%   Lumbar - Right Side Bend 25%   Lumbar - Left Side Bend 25%   Strength   Right Hip Flexion 3/5   Right Hip Extension 3-/5   Right Hip ABduction 3-/5   Right Hip ADduction 3+/5   Left Hip Flexion 4-/5   Left Hip Extension 3+/5   Left Hip ABduction 4-/5   Left Hip ADduction 4-/5   Flexibility   Soft Tissue Assessment /Muscle Length --  HS tight Rt 60 deg/Lt 75  deg; quad Rt 10 in/Lt 7 in from hip   Palpation   Palpation comment tightness QL/lumbar paraspinals, piriformis, hip abductors   Ambulation/Gait   Gait Comments ambulates with wide based gait pattern, LE's ER, hips and knees flexed           [facility] Adult PT Treatment/Exercise - 05/25/15 0001    Exercises   Exercises Knee/Hip;Lumbar   Lumbar Exercises: Supine   Ab Set 5 reps;5 seconds   AB Set Limitations Tactile/VC for proper form.    Glut Set 5 reps;5 seconds   Glut Set Limitations required tactile cues for proper form.    Knee/Hip Exercises: Stretches   Passive Hamstring Stretch Right;Left;2 reps;20 seconds   ITB Stretch Right;Left;1 rep;30 seconds   Knee/Hip Exercises: Supine   Bridges with Ball Squeeze 5 reps;1 set   Modalities   Modalities Electrical Stimulation;Moist Heat   Moist Heat Therapy   Number Minutes Moist Heat 10 Minutes   Moist Heat Location Hip   Electrical Stimulation   Electrical Stimulation Location Rt glute/ piriformis    Electrical Stimulation Action IFC   Electrical Stimulation Parameters to tolerance   Electrical Stimulation Goals Pain                PT Education - 05/25/15 1224    Education provided Yes   Education Details HEP   Person(s) Educated Patient   Methods Explanation;Handout   Comprehension Verbalized understanding          PT Short Term Goals - 05/25/15 1250    PT SHORT TERM GOAL #1   Title Patient I in initial HEP 06/22/15   Time 4   Period Weeks   Status New   PT SHORT TERM GOAL #2   Title Increase ROM Rt hip by 5-10 degrees with HS flexibility and hip flexion 06/22/15   Time 4   Period Weeks   Status New   PT SHORT TERM GOAL #3   Title Patient to tolerate 30 min of exercise in clinic 06/22/15   Time 4   Period Weeks   Status New           PT Long Term Goals - 05/25/15 1252    PT LONG TERM GOAL #1   Title Patient I in HEP for discharge 07/20/15   Time 8   Period Weeks   Status New   PT LONG TERM  GOAL #2   Title Increase strength bilat LE's by 1/2 to 1  muscle grade 07/20/15   Time 8   Period Weeks   Status New   PT LONG TERM GOAL #3   Title Decrease pain rating to 2-3/10 50-75% 07/20/15   Time 8   Period Weeks   Status New   PT LONG TERM GOAL #4   Title Decrease FOTO to </=35% limitation 07/20/15   Time 8   Period Weeks   Status New            Plan - 05/25/15 1244    Clinical Impression Statement Patient presents with chronic LBP with recent increase in symptoms including Rt LBP and raducular Rt LE pain. He has poor posture and alignment; decresed trunk and LE ROM/mobility; weakness bilat LE's Rt > Lt; decreased activity level; lpain on a constant basis.   Pt will benefit from skilled therapeutic intervention in order to improve on the following deficits Pain;Decreased range of motion;Decreased strength;Decreased mobility;Abnormal gait   Rehab Potential Good   PT Frequency 2x / week   PT Duration 8 weeks   PT Treatment/Interventions Patient/family education;ADLs/Self Care Home Management;Therapeutic exercise;Therapeutic activities;Neuromuscular re-education;Cryotherapy;Electrical Stimulation;Moist Heat;Ultrasound;Gait training;Dry needling   PT Next Visit Plan Review HEP; continue education re spine care and body mechanics; progress lumbar stabilization and LE strengthening as tolerated   PT Home Exercise Plan lumbar stabilization; stretching; strengthening   Consulted and Agree with Plan of Care Patient         Problem List Patient Active Problem List   Diagnosis Date Noted  . Right low back pain 05/14/2015  . Asthma, chronic 04/30/2015  . Simple chronic bronchitis 04/30/2015  . Heavy alcohol use 02/26/2015  . Hyponatremia 02/26/2015  . Essential hypertension, benign 02/26/2015  . Tachycardia 02/26/2015  . Atherosclerosis of aorta 10/01/2014  . Psoriasis 08/29/2012  . LIPOMA 07/09/2009  . CANDIDIASIS, SITE NEC 11/08/2006  . ABNORMAL FINDINGS, ELEVATED BP  W/O HTN 11/08/2006    Lynanne Delgreco Nilda Simmer, PT, MPH 05/25/2015, 1:03 PM  [facility] [place], [place], [address] Phone: [phone]   Fax:  [phone]

## 2015-05-25 NOTE — Patient Instructions (Signed)
  Gluteal Sets     Squeeze pelvic floor and hold. Tighten bottom. Hold for _5__ seconds. Repeat _10__ times. Do _1-2__ times a day.Abdominal Bracing With Pelvic Floor (Hook-Lying)   Abdominal bracing   With neutral spine, tighten pelvic floor and abdominals. Tighten muscles at waist. Hold 5-10 seconds.  Repeat _10__ times. Do ___ times a day.    Bridging   Lie on back with feet shoulder width apart. Lift hips toward the ceiling. Hold ___1_ seconds. Repeat _10___ times. Do _1___ sessions per day.   Hamstring Step 1   Straighten left knee. Keep knee level with other knee or on bolster. Hold __30_ seconds. Relax knee by returning foot to start. Repeat _2__ times.  [facility] at [facility] [place], [place] [address]  [phone] (office) [phone] (fax)

## 2015-05-31 ENCOUNTER — Ambulatory Visit (INDEPENDENT_AMBULATORY_CARE_PROVIDER_SITE_OTHER): Payer: 59 | Admitting: Physical Therapy

## 2015-05-31 DIAGNOSIS — M545 Low back pain, unspecified: Secondary | ICD-10-CM

## 2015-05-31 DIAGNOSIS — M7989 Other specified soft tissue disorders: Secondary | ICD-10-CM

## 2015-05-31 DIAGNOSIS — Z7409 Other reduced mobility: Secondary | ICD-10-CM

## 2015-05-31 DIAGNOSIS — R531 Weakness: Secondary | ICD-10-CM

## 2015-05-31 DIAGNOSIS — M79604 Pain in right leg: Secondary | ICD-10-CM

## 2015-05-31 DIAGNOSIS — R6889 Other general symptoms and signs: Secondary | ICD-10-CM

## 2015-05-31 DIAGNOSIS — R29898 Other symptoms and signs involving the musculoskeletal system: Secondary | ICD-10-CM

## 2015-05-31 DIAGNOSIS — M2560 Stiffness of unspecified joint, not elsewhere classified: Secondary | ICD-10-CM | POA: Diagnosis not present

## 2015-05-31 NOTE — Therapy (Signed)
[place], [place], [address] Phone: [phone]   Fax:  [phone]  Physical Therapy Treatment  Patient Details  Name: Derek Lopez MRN: [id number] Date of Birth: 1961-03-02 Referring Provider:  Gregor Hams, MD  Encounter Date: 05/31/2015      PT End of Session - 05/31/15 1148    Visit Number 2   Number of Visits 16   Date for PT Re-Evaluation 07/20/15   PT Start Time 9381   PT Stop Time 1250   PT Time Calculation (min) 65 min      No past medical history on file.  Past Surgical History  Procedure Laterality Date  . Appendectomy    . Tonsillectomy      There were no vitals filed for this visit.  Visit Diagnosis:  LBP radiating to right leg  Stiffness in joint  Weakness of both lower extremities  Decreased strength, endurance, and mobility  Pain and swelling of right lower extremity      Subjective Assessment - 05/31/15 1148    Subjective Pt reports he feels the same as the last visit. Pt hasn't completed any of the exercises from HEP, only uses personal TENS unit on Rt hip to decrease pain.    Currently in Pain? Yes   Pain Score 5    Pain Location Back   Pain Orientation Right   Aggravating Factors  weather, bending over, prolonged standing, walking   Pain Relieving Factors TENS, advil.             [facility] PT Assessment - 05/31/15 0001    Assessment   Medical Diagnosis Rt LBP   Onset Date/Surgical Date 04/08/15   Hand Dominance Right   Next MD Visit 08/16          [facility] Adult PT Treatment/Exercise - 05/31/15 0001    Lumbar Exercises: Seated   Long Arc Quad on Chair Strengthening;Both;1 set;20 reps   Lumbar Exercises: Supine   Bridge 10 reps   Other Supine Lumbar Exercises Hooklying:  Hip add ball squeeze x 5 sec hold x 10 reps - required constant VC for form and pace/counting.    Lumbar Exercises: Sidelying   Clam 10 reps  RLE    Clam Limitations Pt had  difficulty tolerating sidelying position; reported increased LBP   Knee/Hip Exercises: Stretches   Passive Hamstring Stretch Right;Left;2 reps;30 seconds   Passive Hamstring Stretch Limitations supine, required VC for form.    Quad Stretch 2 reps;20 seconds;Right   Piriformis Stretch Right;2 reps;20 seconds   Piriformis Stretch Limitations very limited; performed seated.    Knee/Hip Exercises: Aerobic   Nustep L3: 4.5 min    Knee/Hip Exercises: Seated   Clamshell with TheraBand Red  1 set of 15 reps- for HEP   Other Seated Knee/Hip Exercises Abdominal set x 5 sec hold, x 5 reps.    Abduction/Adduction  --   Abd/Adduction Limitations --   Modalities   Modalities Moist Heat;Electrical Stimulation   Moist Heat Therapy   Number Minutes Moist Heat 15 Minutes   Moist Heat Location Hip   Electrical Stimulation   Electrical Stimulation Location Rt glute/ piriformis    Electrical Stimulation Action IFC   Electrical Stimulation Parameters to tolerance    Electrical Stimulation Goals Pain   Manual Therapy   Manual Therapy Myofascial release;Passive ROM   Myofascial Release to Rt glute, piriformis   point tender in Rt glute med/piriformis   Passive ROM hip ER/IR  with MFR   pt very guarded                 PT Education - 05/31/15 1604    Education provided Yes   Education Details Educated pt on importance of performing HEP to progress in therapy.  HEP - given 2 additional exercises.    Person(s) Educated Patient;Spouse   Methods Handout;Demonstration;Explanation;Tactile cues;Verbal cues   Comprehension Verbalized understanding;Returned demonstration          PT Short Term Goals - 05/25/15 1250    PT SHORT TERM GOAL #1   Title Patient I in initial HEP 06/22/15   Time 4   Period Weeks   Status New   PT SHORT TERM GOAL #2   Title Increase ROM Rt hip by 5-10 degrees with HS flexibility and hip flexion 06/22/15   Time 4   Period Weeks   Status New   PT SHORT TERM GOAL #3    Title Patient to tolerate 30 min of exercise in clinic 06/22/15   Time 4   Period Weeks   Status New           PT Long Term Goals - 05/25/15 1252    PT LONG TERM GOAL #1   Title Patient I in HEP for discharge 07/20/15   Time 8   Period Weeks   Status New   PT LONG TERM GOAL #2   Title Increase strength bilat LE's by 1/2 to 1 muscle grade 07/20/15   Time 8   Period Weeks   Status New   PT LONG TERM GOAL #3   Title Decrease pain rating to 2-3/10 50-75% 07/20/15   Time 8   Period Weeks   Status New   PT LONG TERM GOAL #4   Title Decrease FOTO to </=35% limitation 07/20/15   Time 8   Period Weeks   Status New               Plan - 05/31/15 1245    Clinical Impression Statement Pt had difficulty tolerating sidelying position (either side). Pt very point tender in Rt glute med and piriformis during manual work; very guarded during this. Pt reported decrease in pain in hip at end of therapy. Pt's non-compliance to HEP currently a barrier to progress.     Pt will benefit from skilled therapeutic intervention in order to improve on the following deficits Pain;Decreased range of motion;Decreased strength;Decreased mobility;Abnormal gait   Rehab Potential Good   PT Frequency 2x / week   PT Duration 8 weeks   PT Treatment/Interventions Patient/family education;ADLs/Self Care Home Management;Therapeutic exercise;Therapeutic activities;Neuromuscular re-education;Cryotherapy;Electrical Stimulation;Moist Heat;Ultrasound;Gait training;Dry needling   PT Next Visit Plan Review HEP; continue education re spine care and body mechanics; progress lumbar stabilization and LE strengthening as tolerated   Consulted and Agree with Plan of Care Patient        Problem List Patient Active Problem List   Diagnosis Date Noted  . Right low back pain 05/14/2015  . Asthma, chronic 04/30/2015  . Simple chronic bronchitis 04/30/2015  . Heavy alcohol use 02/26/2015  . Hyponatremia  02/26/2015  . Essential hypertension, benign 02/26/2015  . Tachycardia 02/26/2015  . Atherosclerosis of aorta 10/01/2014  . Psoriasis 08/29/2012  . LIPOMA 07/09/2009  . CANDIDIASIS, SITE NEC 11/08/2006  . ABNORMAL FINDINGS, ELEVATED BP W/O HTN 11/08/2006    Kerin Perna, PTA 05/31/2015 4:05 PM  [facility] [place], [place], [address] Phone: [phone]  Fax:  [phone]

## 2015-05-31 NOTE — Patient Instructions (Signed)
Strengthening: Hip Abductor - Resisted  When sitting:  With band looped around both legs above knees, push thighs apart. Repeat _10___ times per set. Do _2-3___ sets per session. Do _1___ sessions per day.  Hip Stretch   Put right ankle over left knee. Let right knee fall downward, but keep ankle in place. Feel the stretch in hip. May push down gently with hand to feel stretch. Hold __30_ seconds while counting out loud. Repeat with other leg. Repeat _2___ times. Do _1___ sessions per day.   [facility] Re[facility] at [facility] [place], [place] [address]  [phone] (office) [phone] (fax)

## 2015-06-03 ENCOUNTER — Encounter: Payer: Self-pay | Admitting: Rehabilitative and Restorative Service Providers"

## 2015-06-03 ENCOUNTER — Ambulatory Visit (INDEPENDENT_AMBULATORY_CARE_PROVIDER_SITE_OTHER): Payer: 59 | Admitting: Rehabilitative and Restorative Service Providers"

## 2015-06-03 DIAGNOSIS — R6889 Other general symptoms and signs: Secondary | ICD-10-CM

## 2015-06-03 DIAGNOSIS — R531 Weakness: Secondary | ICD-10-CM

## 2015-06-03 DIAGNOSIS — M2560 Stiffness of unspecified joint, not elsewhere classified: Secondary | ICD-10-CM | POA: Diagnosis not present

## 2015-06-03 DIAGNOSIS — Z7409 Other reduced mobility: Secondary | ICD-10-CM | POA: Diagnosis not present

## 2015-06-03 DIAGNOSIS — M545 Low back pain, unspecified: Secondary | ICD-10-CM

## 2015-06-03 DIAGNOSIS — M79604 Pain in right leg: Secondary | ICD-10-CM

## 2015-06-03 DIAGNOSIS — R29898 Other symptoms and signs involving the musculoskeletal system: Secondary | ICD-10-CM

## 2015-06-03 DIAGNOSIS — M7989 Other specified soft tissue disorders: Secondary | ICD-10-CM

## 2015-06-03 NOTE — Therapy (Signed)
[place], [place], [address] Phone: [phone]   Fax:  [phone]  Physical Therapy Treatment  Patient Details  Name: Derek Lopez MRN: [id number] Date of Birth: 15-Nov-1960 Referring Provider:  Gregor Hams, MD  Encounter Date: 06/03/2015      PT End of Session - 06/03/15 1753    Visit Number 3   Number of Visits 16   Date for PT Re-Evaluation 07/20/15   PT Start Time 0502   PT Stop Time 0559   PT Time Calculation (min) 57 min   Activity Tolerance Patient tolerated treatment well      History reviewed. No pertinent past medical history.  Past Surgical History  Procedure Laterality Date  . Appendectomy    . Tonsillectomy      There were no vitals filed for this visit.  Visit Diagnosis:  LBP radiating to right leg  Stiffness in joint  Weakness of both lower extremities  Decreased strength, endurance, and mobility  Pain and swelling of right lower extremity      Subjective Assessment - 06/03/15 1708    Subjective Patinet reports that the pain is there every day. He is trying to walk more at home and he has done some exercises at home. Has not used the TENS unit in "a while".    Currently in Pain? Yes   Pain Score 5    Pain Location Back   Pain Orientation Right   Pain Descriptors / Indicators Nagging   Pain Type Chronic pain   Pain Onset More than a month ago   Pain Frequency Constant                         [facility] Adult PT Treatment/Exercise - 06/03/15 0001    Ambulation/Gait   Gait Comments gait with single point cane - poor gait pattern attemted to instruct patient in three point gait pattern but he stated he was just fine the way he was    Self-Care   Self-Care --  encouraged use of TENS unit at home   Lumbar Exercises: Seated   Long Arc Quad on Chair Right;Left;1 set;10 reps   Sit to Stand Limitations seated row 10 reps 2 sets red theraband    Lumbar Exercises: Supine   Ab Set 10 reps  10 sec hold   AB Set Limitations Tactile/VC for proper form.    Bridge 10 reps;2 seconds  2 sets   Knee/Hip Exercises: Stretches   Passive Hamstring Stretch Right;Left;3 reps;30 seconds   Passive Hamstring Stretch Limitations supine, required VC for form.    Piriformis Stretch 2 reps;20 seconds   Piriformis Stretch Limitations supine with strap   Knee/Hip Exercises: Aerobic   Nustep L3 6 min    Knee/Hip Exercises: Standing   Forward Step Up 1 set;15 reps;Both   Knee/Hip Exercises: Supine   Other Supine Knee/Hip Exercises ball squeeze 10 reps 2 sets    Modalities   Modalities Moist Heat  patient in sitting   Moist Heat Therapy   Number Minutes Moist Heat 15 Minutes   Moist Heat Location Hip   Electrical Stimulation   Electrical Stimulation Location Rt glute/ piriformis    Electrical Stimulation Parameters to tolerance   Electrical Stimulation Goals Pain                PT Education - 06/03/15 1752    Education provided Yes   Education Details Encouraged patient  to continue with increased activity level - walking more at home and doing his exercises.    Person(s) Educated Patient   Methods Explanation;Demonstration;Tactile cues;Verbal cues   Comprehension Verbalized understanding;Returned demonstration;Verbal cues required;Tactile cues required          PT Short Term Goals - 06/03/15 1755    PT SHORT TERM GOAL #1   Title Patient I in initial HEP 06/22/15   Time 4   Period Weeks   Status On-going   PT SHORT TERM GOAL #2   Title Increase ROM Rt hip by 5-10 degrees with HS flexibility and hip flexion 06/22/15   Time 4   Period Weeks   Status On-going   PT SHORT TERM GOAL #3   Title Patient to tolerate 30 min of exercise in clinic 06/22/15   Time 4   Period Weeks   Status On-going           PT Long Term Goals - 06/03/15 1756    PT LONG TERM GOAL #1   Title Patient I in HEP for discharge 07/20/15   Time 8    Period Weeks   Status On-going   PT LONG TERM GOAL #2   Title Increase strength bilat LE's by 1/2 to 1 muscle grade 07/20/15   Time 8   Period Weeks   Status On-going   PT LONG TERM GOAL #3   Title Decrease pain rating to 2-3/10 50-75% 07/20/15   Time 8   Period Weeks   Status On-going   PT LONG TERM GOAL #4   Title Decrease FOTO to </=35% limitation 07/20/15   Time 8   Period Weeks   Status On-going               Plan - 06/03/15 1754    Clinical Impression Statement Difficulty with correct technique for exercises; limited tolerance for standing exercises/activities. Patient did increase activity level statiing that he walked more at home and did some of the exercises.   Pt will benefit from skilled therapeutic intervention in order to improve on the following deficits Pain;Decreased range of motion;Decreased strength;Decreased mobility;Abnormal gait   Rehab Potential Good   PT Frequency 2x / week   PT Duration 8 weeks   PT Treatment/Interventions Patient/family education;ADLs/Self Care Home Management;Therapeutic exercise;Therapeutic activities;Neuromuscular re-education;Cryotherapy;Electrical Stimulation;Moist Heat;Ultrasound;Gait training;Dry needling   PT Next Visit Plan Review HEP; continue education re spine care and body mechanics; progress lumbar stabilization and LE strengthening as tolerated   PT Home Exercise Plan lumbar stabilization; stretching; strengthening   Consulted and Agree with Plan of Care Patient        Problem List Patient Active Problem List   Diagnosis Date Noted  . Right low back pain 05/14/2015  . Asthma, chronic 04/30/2015  . Simple chronic bronchitis 04/30/2015  . Heavy alcohol use 02/26/2015  . Hyponatremia 02/26/2015  . Essential hypertension, benign 02/26/2015  . Tachycardia 02/26/2015  . Atherosclerosis of aorta 10/01/2014  . Psoriasis 08/29/2012  . LIPOMA 07/09/2009  . CANDIDIASIS, SITE NEC 11/08/2006  . ABNORMAL FINDINGS,  ELEVATED BP W/O HTN 11/08/2006    Ciarra Braddy Nilda Simmer, PT, MPH 06/03/2015, 5:57 PM  [facility] [place], [place], [address] Phone: [phone]   Fax:  [phone]

## 2015-06-08 ENCOUNTER — Ambulatory Visit (INDEPENDENT_AMBULATORY_CARE_PROVIDER_SITE_OTHER): Payer: 59 | Admitting: Physical Therapy

## 2015-06-08 DIAGNOSIS — Z7409 Other reduced mobility: Secondary | ICD-10-CM | POA: Diagnosis not present

## 2015-06-08 DIAGNOSIS — R29898 Other symptoms and signs involving the musculoskeletal system: Secondary | ICD-10-CM | POA: Diagnosis not present

## 2015-06-08 DIAGNOSIS — R531 Weakness: Secondary | ICD-10-CM

## 2015-06-08 DIAGNOSIS — M79604 Pain in right leg: Secondary | ICD-10-CM

## 2015-06-08 DIAGNOSIS — M545 Low back pain, unspecified: Secondary | ICD-10-CM

## 2015-06-08 DIAGNOSIS — M7989 Other specified soft tissue disorders: Secondary | ICD-10-CM

## 2015-06-08 DIAGNOSIS — M2560 Stiffness of unspecified joint, not elsewhere classified: Secondary | ICD-10-CM | POA: Diagnosis not present

## 2015-06-08 NOTE — Therapy (Signed)
[place], [place], [address] Phone: [phone]   Fax:  [phone]  Physical Therapy Treatment  Patient Details  Name: Derek Lopez MRN: [id number] Date of Birth: 28-Feb-1961 Referring Provider:  Gregor Hams, MD  Encounter Date: 06/08/2015      PT End of Session - 06/08/15 1148    Visit Number 4   Number of Visits 16   Date for PT Re-Evaluation 07/20/15   PT Start Time 3154   PT Stop Time 0086   PT Time Calculation (min) 48 min   Activity Tolerance Patient limited by pain;Patient limited by fatigue      No past medical history on file.  Past Surgical History  Procedure Laterality Date  . Appendectomy    . Tonsillectomy      There were no vitals filed for this visit.  Visit Diagnosis:  LBP radiating to right leg  Stiffness in joint  Weakness of both lower extremities  Decreased strength, endurance, and mobility  Pain and swelling of right lower extremity      Subjective Assessment - 06/08/15 1150    Subjective Pt reports he feels no change in symptoms. He has been trying to walk more and perform HEP exercises. s   Currently in Pain? Yes   Pain Score 5    Pain Location Back   Pain Orientation Right   Pain Descriptors / Indicators Nagging   Aggravating Factors  weather, bending over, prolonged standing, walking   Pain Relieving Factors advil, TENS use            [facility] PT Assessment - 06/08/15 0001    Assessment   Medical Diagnosis Rt LBP   Onset Date/Surgical Date 04/08/15   Hand Dominance Right   Next MD Visit 06/11/15   Strength   Right Hip Flexion 4-/5   Right Hip Extension 3/5   Right Hip ABduction 5/5   Left Hip Flexion --  5-/5   Left Hip Extension 3-/5   Left Hip ABduction --  5-/5                     [facility] Adult PT Treatment/Exercise - 06/08/15 0001    Lumbar Exercises: Stretches   Single Knee to Chest Stretch 1 rep;10 seconds  each  leg   Single Knee to Chest Stretch Limitations Pt reported increased Rt LBP with Rt knee to chest.    Lower Trunk Rotation 5 reps;10 seconds   Lumbar Exercises: Standing   Heel Raises 5 reps  2 sets    Lumbar Exercises: Seated   Sit to Stand --  8 reps, with UE support    Lumbar Exercises: Supine   Clam 10 reps  with green band at thigh, 2 sets   Bridge --   Bridge Limitations 7 reps with 3 sec hold in ext. Pt stopped due to increased LBP.    Lumbar Exercises: Prone   Other Prone Lumbar Exercises POE 30 sec.    Knee/Hip Exercises: Stretches   Sports administrator Right;Left;2 reps;30 seconds   Hip Flexor Stretch Right;Left;1 rep;20 seconds   Other Knee/Hip Stretches Bilat hip ext stretch x 5 sec forward x 5 reps    Knee/Hip Exercises: Aerobic   Nustep L4: 5 min    Modalities   Modalities Electrical Stimulation;Moist Heat   Moist Heat Therapy   Number Minutes Moist Heat 15 Minutes   Moist Heat Location Lumbar Spine   Electrical Stimulation  Electrical Stimulation Location bilat low back paraspinals   Electrical Stimulation Action IFC   Electrical Stimulation Parameters  to tolerance x 15 min    Electrical Stimulation Goals Pain                  PT Short Term Goals - 06/03/15 1755    PT SHORT TERM GOAL #1   Title Patient I in initial HEP 06/22/15   Time 4   Period Weeks   Status On-going   PT SHORT TERM GOAL #2   Title Increase ROM Rt hip by 5-10 degrees with HS flexibility and hip flexion 06/22/15   Time 4   Period Weeks   Status On-going   PT SHORT TERM GOAL #3   Title Patient to tolerate 30 min of exercise in clinic 06/22/15   Time 4   Period Weeks   Status On-going           PT Long Term Goals - 06/03/15 1756    PT LONG TERM GOAL #1   Title Patient I in HEP for discharge 07/20/15   Time 8   Period Weeks   Status On-going   PT LONG TERM GOAL #2   Title Increase strength bilat LE's by 1/2 to 1 muscle grade 07/20/15   Time 8   Period Weeks   Status  On-going   PT LONG TERM GOAL #3   Title Decrease pain rating to 2-3/10 50-75% 07/20/15   Time 8   Period Weeks   Status On-going   PT LONG TERM GOAL #4   Title Decrease FOTO to </=35% limitation 07/20/15   Time 8   Period Weeks   Status On-going               Plan - 06/08/15 1227    Clinical Impression Statement Pt demo increase in leg strength since beginning therapy.  Pt continues with decreased activity tolerance requiring frequent short rest breaks and limited reps of each activity; limited tolerance to standing exercises/activities.   Slow progress towards goals.    Pt will benefit from skilled therapeutic intervention in order to improve on the following deficits Pain;Decreased range of motion;Decreased strength;Decreased mobility;Abnormal gait   Rehab Potential Good   PT Frequency 2x / week   PT Duration 8 weeks   PT Treatment/Interventions Patient/family education;ADLs/Self Care Home Management;Therapeutic exercise;Therapeutic activities;Neuromuscular re-education;Cryotherapy;Electrical Stimulation;Moist Heat;Ultrasound;Gait training;Dry needling   PT Next Visit Plan Continue progressive core/ lumbar stabilization.  Write MD note next visit, appt following day.    Consulted and Agree with Plan of Care Patient        Problem List Patient Active Problem List   Diagnosis Date Noted  . Right low back pain 05/14/2015  . Asthma, chronic 04/30/2015  . Simple chronic bronchitis 04/30/2015  . Heavy alcohol use 02/26/2015  . Hyponatremia 02/26/2015  . Essential hypertension, benign 02/26/2015  . Tachycardia 02/26/2015  . Atherosclerosis of aorta 10/01/2014  . Psoriasis 08/29/2012  . LIPOMA 07/09/2009  . CANDIDIASIS, SITE NEC 11/08/2006  . ABNORMAL FINDINGS, ELEVATED BP W/O HTN 11/08/2006   Kerin Perna, PTA 06/08/2015 12:46 PM  [place], [place], [address] Phone:  [phone]   Fax:  [phone]

## 2015-06-10 ENCOUNTER — Ambulatory Visit (INDEPENDENT_AMBULATORY_CARE_PROVIDER_SITE_OTHER): Payer: 59 | Admitting: Physical Therapy

## 2015-06-10 DIAGNOSIS — R6889 Other general symptoms and signs: Secondary | ICD-10-CM

## 2015-06-10 DIAGNOSIS — R29898 Other symptoms and signs involving the musculoskeletal system: Secondary | ICD-10-CM

## 2015-06-10 DIAGNOSIS — Z7409 Other reduced mobility: Secondary | ICD-10-CM | POA: Diagnosis not present

## 2015-06-10 DIAGNOSIS — M7989 Other specified soft tissue disorders: Secondary | ICD-10-CM

## 2015-06-10 DIAGNOSIS — M545 Low back pain, unspecified: Secondary | ICD-10-CM

## 2015-06-10 DIAGNOSIS — M2560 Stiffness of unspecified joint, not elsewhere classified: Secondary | ICD-10-CM | POA: Diagnosis not present

## 2015-06-10 DIAGNOSIS — R531 Weakness: Secondary | ICD-10-CM

## 2015-06-10 DIAGNOSIS — M79604 Pain in right leg: Secondary | ICD-10-CM

## 2015-06-10 NOTE — Therapy (Signed)
[place], [place], [address] Phone: [phone]   Fax:  [phone]  Physical Therapy Treatment  Patient Details  Name: Derek Lopez MRN: [id number] Date of Birth: May 01, 1961 Referring Provider:  Gregor Hams, MD  Encounter Date: 06/10/2015      PT End of Session - 06/10/15 1705    Visit Number 5   Number of Visits 16   Date for PT Re-Evaluation 07/20/15   PT Start Time 1700   PT Stop Time 2426   PT Time Calculation (min) 48 min   Activity Tolerance Patient limited by fatigue;Patient limited by pain      No past medical history on file.  Past Surgical History  Procedure Laterality Date  . Appendectomy    . Tonsillectomy      There were no vitals filed for this visit.  Visit Diagnosis:  LBP radiating to right leg  Stiffness in joint  Weakness of both lower extremities  Decreased strength, endurance, and mobility  Pain and swelling of right lower extremity      Subjective Assessment - 06/10/15 1707    Subjective Pt reports he is still hurting in Rt hip, feels minimal change in symptoms. He reports he has been doing exercises and walking more.    Currently in Pain? Yes   Pain Score 5    Pain Location Back   Pain Orientation Right   Pain Descriptors / Indicators Nagging   Pain Radiating Towards into Rt hip to back of Rt knee    Aggravating Factors  weather, bending over, prolonged standing, walking    Pain Relieving Factors medicine, rest              [facility] Adult PT Treatment/Exercise - 06/10/15 0001    Lumbar Exercises: Supine   Straight Leg Raise 10 reps   Knee/Hip Exercises: Stretches   Piriformis Stretch 1 rep;Right;Left;30 seconds   Knee/Hip Exercises: Aerobic   Nustep L4: 8 min    Knee/Hip Exercises: Standing   Other Standing Knee Exercises Side stepping without UE support x 4 feet x 4 reps- mulitple attempts. Pt continued to forward stepping despite cues   Knee/Hip Exercises: Seated   Sit to Sand 1 set;10 reps;without UE support;with UE support  5 with support, 5 without support   Knee/Hip Exercises: Supine   Other Supine Knee/Hip Exercises hooklying hip abduction with yellow band above knees x 20 reps    Other Supine Knee/Hip Exercises pillow squeeze x 3 sec hold x 10    Modalities   Modalities Electrical Stimulation;Moist Heat   Moist Heat Therapy   Number Minutes Moist Heat 10 Minutes   Moist Heat Location Lumbar Spine   Electrical Stimulation   Electrical Stimulation Location bilat low back    Electrical Stimulation Action IFC   Electrical Stimulation Parameters to tolerance x 10 min    Electrical Stimulation Goals Pain   Manual Therapy   Manual therapy comments TPR to Rt glute med, piriformis, and QL to tolerance                   PT Short Term Goals - 06/10/15 1759    PT SHORT TERM GOAL #1   Title Patient I in initial HEP 06/22/15   Time 4   Period Weeks   Status Achieved   PT SHORT TERM GOAL #2   Title Increase ROM Rt hip by 5-10 degrees with HS flexibility and hip flexion 06/22/15   Time  4   Period Weeks   Status On-going   PT SHORT TERM GOAL #3   Title Patient to tolerate 30 min of exercise in clinic 06/22/15   Time 4   Period Weeks   Status On-going           PT Long Term Goals - 06/03/15 1756    PT LONG TERM GOAL #1   Title Patient I in HEP for discharge 07/20/15   Time 8   Period Weeks   Status On-going   PT LONG TERM GOAL #2   Title Increase strength bilat LE's by 1/2 to 1 muscle grade 07/20/15   Time 8   Period Weeks   Status On-going   PT LONG TERM GOAL #3   Title Decrease pain rating to 2-3/10 50-75% 07/20/15   Time 8   Period Weeks   Status On-going   PT LONG TERM GOAL #4   Title Decrease FOTO to </=35% limitation 07/20/15   Time 8   Period Weeks   Status On-going               Plan - 06/10/15 1753    Clinical Impression Statement Pt with slight increase in activity  tolerance today. Pt demonstrated difficulties following commands,  and movement in extremities jerky/ not smooth. Pt demo slight increase in leg strength since beginning of therapy.   Pt reported limited relief with manual therapy, slight decrease in pain with use of MHP and estim.  Slow progress towards goals.    Pt will benefit from skilled therapeutic intervention in order to improve on the following deficits Pain;Decreased range of motion;Decreased strength;Decreased mobility;Abnormal gait   Rehab Potential Good   PT Frequency 2x / week   PT Duration 8 weeks   PT Treatment/Interventions Patient/family education;ADLs/Self Care Home Management;Therapeutic exercise;Therapeutic activities;Neuromuscular re-education;Cryotherapy;Electrical Stimulation;Moist Heat;Ultrasound;Gait training;Dry needling   PT Next Visit Plan Continue progressive core/ lumbar stabilization.     PT Home Exercise Plan lumbar stabilization; stretching; strengthening   Consulted and Agree with Plan of Care Patient        Problem List Patient Active Problem List   Diagnosis Date Noted  . Right low back pain 05/14/2015  . Asthma, chronic 04/30/2015  . Simple chronic bronchitis 04/30/2015  . Heavy alcohol use 02/26/2015  . Hyponatremia 02/26/2015  . Essential hypertension, benign 02/26/2015  . Tachycardia 02/26/2015  . Atherosclerosis of aorta 10/01/2014  . Psoriasis 08/29/2012  . LIPOMA 07/09/2009  . CANDIDIASIS, SITE NEC 11/08/2006  . ABNORMAL FINDINGS, ELEVATED BP W/O HTN 11/08/2006   Kerin Perna, PTA 06/10/2015 6:01 PM  [facility] [place], [place], [address] Phone: [phone]   Fax:  [phone]

## 2015-06-11 ENCOUNTER — Encounter: Payer: Self-pay | Admitting: Family Medicine

## 2015-06-11 ENCOUNTER — Ambulatory Visit (INDEPENDENT_AMBULATORY_CARE_PROVIDER_SITE_OTHER): Payer: 59 | Admitting: Family Medicine

## 2015-06-11 VITALS — BP 135/85 | HR 71 | Wt 168.0 lb

## 2015-06-11 DIAGNOSIS — M545 Low back pain, unspecified: Secondary | ICD-10-CM

## 2015-06-11 NOTE — Patient Instructions (Signed)
Thank you for coming in today. Call or go to the ER if you develop a large red swollen joint with extreme pain or oozing puss.  Return in 4 weeks.  Continue physical therapy.  If not better we will get a MRI.   Sacroiliac (SI) Joint Injection Patient Information  Description: The sacroiliac joint connects the scrum (very low back and tailbone) to the ilium (a pelvic bone which also forms half of the hip joint).  Normally this joint experiences very little motion.  When this joint becomes inflamed or unstable low back and or hip and pelvis pain may result.  Injection of this joint with local anesthetics (numbing medicines) and steroids can provide diagnostic information and reduce pain.  This injection is performed with the aid of x-ray guidance into the tailbone area while you are lying on your stomach.   You may experience an electrical sensation down the leg while this is being done.  You may also experience numbness.  We also may ask if we are reproducing your normal pain during the injection.  Conditions which may be treated SI injection:   Low back, buttock, hip or leg pain  1.   Possible side effects:   Bleeding from needle site  Infection (rare, may require surgery)  Nerve injury (rare)  Numbness & tingling (temporary)  A brief convulsion or seizure  Light-headedness (temporary)  Pain at injection site (several days)  Decreased blood pressure (temporary)  Weakness in the leg (temporary)   Call if you experience:   New onset weakness or numbness of an extremity below the injection site that last more than 8 hours.  Hives or difficulty breathing ( go to the emergency room)  Inflammation or drainage at the injection site  Any new symptoms which are concerning to you  Please note:  Although the local anesthetic injected can often make your back/ hip/ buttock/ leg feel good for several hours after the injections, the pain will likely return.  It takes 3-7 days  for steroids to work in the sacroiliac area.  You may not notice any pain relief for at least that one week.  If effective, we will often do a series of three injections spaced 3-6 weeks apart to maximally decrease your pain.  After the initial series, we generally will wait some months before a repeat injection of the same type.

## 2015-06-11 NOTE — Progress Notes (Signed)
Derek Lopez is a 54 y.o. male who presents to [facility]  today for follow-up back pain. Patient was seen several weeks ago for low back pain. He's had an x-ray which showed diffuse degenerative disease. Additionally he had a few episodes of physical therapy which have been mildly helpful. He notes continued right-sided low back pain. The pain radiates to his buttocks. He denies any new weakness or numbness loss of function. At this point he is ready for an intervention if possible.   No past medical history on file. Past Surgical History  Procedure Laterality Date  . Appendectomy    . Tonsillectomy     Social History  Substance Use Topics  . Smoking status: Current Every Day Smoker -- 1.00 packs/day    Types: Cigarettes  . Smokeless tobacco: Not on file  . Alcohol Use: 29.4 oz/week    49 Cans of beer per week   ROS as above Medications: Current Outpatient Prescriptions  Medication Sig Dispense Refill  . albuterol (PROVENTIL HFA;VENTOLIN HFA) 108 (90 BASE) MCG/ACT inhaler Inhale 2 puffs into the lungs every 6 (six) hours as needed for wheezing or shortness of breath. 1 Inhaler 1  . Fluticasone Furoate-Vilanterol (BREO ELLIPTA) 100-25 MCG/INH AEPB Inhale 1 puff into the lungs daily. 60 each 6  . metoprolol succinate (TOPROL-XL) 50 MG 24 hr tablet Take 1 tablet (50 mg total) by mouth 2 (two) times daily. Take with or immediately following a meal. 60 tablet 2   No current facility-administered medications for this visit.   Allergies  Allergen Reactions  . Penicillins     REACTION: rash     Exam:  BP 135/85 mmHg  Pulse 71  Wt 168 lb (76.204 kg) Gen: Well NAD HEENT: EOMI,  MMM Lungs: Normal work of breathing. CTABL Heart: RRR no MRG Abd: NABS, Soft. Nondistended, Nontender Exts: Brisk capillary refill, warm and well perfused. Back: Tender palpation right SI. Lower extremity strength is equal and normal bilaterally. Sensation intact  throughout  Procedure: Real-time Ultrasound Guided Injection of right SI joint. Diagnostic and therapeutic injection. Device: GE Logiq E  Images permanently stored and available for review in the ultrasound unit. Verbal informed consent obtained. Discussed risks and benefits of procedure. Warned about infection bleeding damage to structures skin hypopigmentation and fat atrophy among others. Patient expresses understanding and agreement Time-out conducted.  Noted no overlying erythema, induration, or other signs of local infection.  Skin prepped in a sterile fashion.  Local anesthesia: Topical Ethyl chloride.  With sterile technique and under real time ultrasound guidance: 40 mg Kenalog and 4 mL Marcaine injected easily.  Completed without difficulty  Pain immediately resolved suggesting accurate placement of the medication.  Advised to call if fevers/chills, erythema, induration, drainage, or persistent bleeding.  Images permanently stored and available for review in the ultrasound unit.  Impression: Technically successful ultrasound guided injection.     No results found for this or any previous visit (from the past 24 hour(s)). No results found.   Please see individual assessment and plan sections.

## 2015-06-11 NOTE — Assessment & Plan Note (Signed)
Likely due to right SI joint pain. Patient had significant improvement immediately following SI joint injection. Continue physical therapy. Return in 4 weeks

## 2015-06-15 ENCOUNTER — Ambulatory Visit (INDEPENDENT_AMBULATORY_CARE_PROVIDER_SITE_OTHER): Payer: 59 | Admitting: Rehabilitative and Restorative Service Providers"

## 2015-06-15 ENCOUNTER — Encounter: Payer: Self-pay | Admitting: Rehabilitative and Restorative Service Providers"

## 2015-06-15 DIAGNOSIS — R29898 Other symptoms and signs involving the musculoskeletal system: Secondary | ICD-10-CM

## 2015-06-15 DIAGNOSIS — M2560 Stiffness of unspecified joint, not elsewhere classified: Secondary | ICD-10-CM

## 2015-06-15 DIAGNOSIS — M545 Low back pain: Secondary | ICD-10-CM

## 2015-06-15 DIAGNOSIS — R531 Weakness: Secondary | ICD-10-CM

## 2015-06-15 DIAGNOSIS — M7989 Other specified soft tissue disorders: Secondary | ICD-10-CM

## 2015-06-15 DIAGNOSIS — Z7409 Other reduced mobility: Secondary | ICD-10-CM | POA: Diagnosis not present

## 2015-06-15 DIAGNOSIS — M79604 Pain in right leg: Secondary | ICD-10-CM

## 2015-06-15 NOTE — Therapy (Addendum)
[facility] [place], [place], [address] Phone: [phone]   Fax:  [phone]  Physical Therapy Treatment  Patient Details  Name: Derek Lopez MRN: [id number] Date of Birth: Nov 22, 1960 Referring Provider:  Gregor Hams, MD  Encounter Date: 06/15/2015      PT End of Session - 06/15/15 1147    Visit Number 6   Number of Visits 16   Date for PT Re-Evaluation 07/20/15   PT Start Time 1975   PT Stop Time 1243   PT Time Calculation (min) 56 min   Activity Tolerance Patient limited by fatigue;Patient limited by pain      History reviewed. No pertinent past medical history.  Past Surgical History  Procedure Laterality Date  . Appendectomy    . Tonsillectomy      There were no vitals filed for this visit.  Visit Diagnosis:  LBP radiating to right leg  Stiffness in joint  Weakness of both lower extremities  Decreased strength, endurance, and mobility  Pain and swelling of right lower extremity      Subjective Assessment - 06/15/15 1147    Subjective Patient reports that he received a cortisone injection 06/11/15 (in the Rt SI joint). He reports that it has not helped yet but MD said in may take a week to "kick in". Derek Lopez reports that he is "trying" to do some exercises at home and "trying " to walk more.  Mostly sitting at home during the day.    Currently in Pain? Yes   Pain Score 5    Pain Location Back   Pain Orientation Right   Pain Descriptors / Indicators Nagging   Pain Type Chronic pain            [facility] PT Assessment - 06/15/15 0001    Assessment   Medical Diagnosis Rt LBP   Onset Date/Surgical Date 04/08/15   Hand Dominance Right   AROM   Right Hip Extension -5   Right Hip Flexion 130   Right Hip ABduction 35   Left Hip Extension 0   Left Hip Flexion 140   Left Hip ABduction 45   Strength   Right Hip Flexion 4-/5   Right Hip Extension 3/5   Right Hip ABduction 4+/5   Right Hip ADduction 5/5   Left Hip Flexion 4+/5   Left Hip Extension 3-/5   Left Hip ABduction 4+/5   Left Hip ADduction 4+/5   Flexibility   Soft Tissue Assessment /Muscle Length --  HS Lt 75 deg/Rt 78 deg                      [facility] Adult PT Treatment/Exercise - 06/15/15 0001    Knee/Hip Exercises: Stretches   Passive Hamstring Stretch 1 rep;20 seconds;Both  thearpist assisted   Piriformis Stretch 2 reps;20 seconds  therapist assisted   Knee/Hip Exercises: Aerobic   Nustep L4: 8 min    Knee/Hip Exercises: Standing   Knee Flexion Limitations marching 10 reps 2 sets    Wall Squat Limitations shallow knee bends 10 reps   Knee/Hip Exercises: Seated   Ball Squeeze 2 sets of 10    Clamshell with TheraBand Red  2 sets of 10   Sit to Sand 1 set;10 reps  3 pushing up from table/others hands forward   Knee/Hip Exercises: Supine   Bridges Limitations 10 reps   Bridges with [employer] 10 reps  5 sec hold   Other  Supine Knee/Hip Exercises hooklying hip abduction with red band above knees x 20 reps    Other Supine Knee/Hip Exercises ball squeeze x 3 sec hold x 10    Modalities   Modalities Electrical Stimulation;Moist Heat   Moist Heat Therapy   Number Minutes Moist Heat 15 Minutes   Moist Heat Location Lumbar Spine;Hip   Electrical Stimulation   Electrical Stimulation Location bilat low back/Rt hip   Electrical Stimulation Action IFC   Electrical Stimulation Parameters to tolerance   Electrical Stimulation Goals Pain                  PT Short Term Goals - 06/10/15 1759    PT SHORT TERM GOAL #1   Title Patient I in initial HEP 06/22/15   Time 4   Period Weeks   Status Achieved   PT SHORT TERM GOAL #2   Title Increase ROM Rt hip by 5-10 degrees with HS flexibility and hip flexion 06/22/15   Time 4   Period Weeks   Status On-going   PT SHORT TERM GOAL #3   Title Patient to tolerate 30 min of exercise in clinic 06/22/15   Time 4   Period Weeks    Status On-going           PT Long Term Goals - 06/03/15 1756    PT LONG TERM GOAL #1   Title Patient I in HEP for discharge 07/20/15   Time 8   Period Weeks   Status On-going   PT LONG TERM GOAL #2   Title Increase strength bilat LE's by 1/2 to 1 muscle grade 07/20/15   Time 8   Period Weeks   Status On-going   PT LONG TERM GOAL #3   Title Decrease pain rating to 2-3/10 50-75% 07/20/15   Time 8   Period Weeks   Status On-going   PT LONG TERM GOAL #4   Title Decrease FOTO to </=35% limitation 07/20/15   Time 8   Period Weeks   Status On-going               Plan - 06/15/15 1238    Clinical Impression Statement Derek Lopez received injection in Rt SI 06/11/15 with no significant change per patient report. Patient has difficulty following commands/directions for exercises; limited tolerance for positions for exercises; limited endurance. He does demonstrate some improvement in LE strength and flexibility but remains limted in strength in bilat LE's., limited ambulation and endurance.   Pt will benefit from skilled therapeutic intervention in order to improve on the following deficits Pain;Decreased range of motion;Decreased strength;Decreased mobility;Abnormal gait   PT Frequency 2x / week   PT Duration 8 weeks   PT Treatment/Interventions Patient/family education;ADLs/Self Care Home Management;Therapeutic exercise;Therapeutic activities;Neuromuscular re-education;Cryotherapy;Electrical Stimulation;Moist Heat;Ultrasound;Gait training;Dry needling   PT Next Visit Plan Continue progressive core/ lumbar stabilization.     PT Home Exercise Plan lumbar stabilization; stretching; strengthening   Consulted and Agree with Plan of Care Patient        Problem List Patient Active Problem List   Diagnosis Date Noted  . Right low back pain 05/14/2015  . Asthma, chronic 04/30/2015  . Simple chronic bronchitis 04/30/2015  . Heavy alcohol use 02/26/2015  . Hyponatremia 02/26/2015  .  Essential hypertension, benign 02/26/2015  . Tachycardia 02/26/2015  . Atherosclerosis of aorta 10/01/2014  . Psoriasis 08/29/2012  . LIPOMA 07/09/2009  . CANDIDIASIS, SITE NEC 11/08/2006  . ABNORMAL FINDINGS, ELEVATED BP W/O HTN 11/08/2006  Everardo All, PT, MPH 06/15/2015, 12:50 PM  [facility] [place], [place], [address] Phone: [phone]   Fax:  [phone]     PHYSICAL THERAPY DISCHARGE SUMMARY  Visits from Start of Care: 6  Current functional level related to goals / functional outcomes: Minimal improvement with treatment. Non compliant with HEP.   Remaining deficits: Limited mobility, strength, function   Education / Equipment: HEP  Plan: Patient agrees to discharge.  Patient goals were not met. Patient is being discharged due to the patient's request.  ?????    Derek Lopez P. Clarrisa Kaylor PT, MPH 07/02/2015 3:57 PM  PHYSICAL THERAPY DISCHARGE SUMMARY  Visits from Start of Care: 6  Current functional level related to goals / functional outcomes: Improved exercise tolerance and ambulation at home per pt/spouse report. Some improvement in strength and functional mobility.    Remaining deficits: Continued decreased activity level at home. Needs to work on [employer] and increasing level of physical activity    Education / Equipment: HEP  Plan: Patient agrees to discharge.  Patient goals were partially met. Patient is being discharged due to the patient's request.  ?????   Rayla Pember P. Helene Kelp PT, MPH 09/20/2015 3:05 PM

## 2015-06-17 ENCOUNTER — Encounter: Payer: 59 | Admitting: Rehabilitative and Restorative Service Providers"

## 2015-07-06 ENCOUNTER — Other Ambulatory Visit: Payer: Self-pay | Admitting: Family Medicine

## 2015-07-06 MED ORDER — METOPROLOL SUCCINATE ER 50 MG PO TB24: 50.0000 mg | ORAL_TABLET | Freq: Two times a day (BID) | ORAL | Status: DC

## 2015-07-09 ENCOUNTER — Ambulatory Visit: Payer: 59 | Admitting: Family Medicine

## 2015-07-14 ENCOUNTER — Ambulatory Visit (INDEPENDENT_AMBULATORY_CARE_PROVIDER_SITE_OTHER): Payer: 59 | Admitting: Family Medicine

## 2015-07-14 ENCOUNTER — Encounter: Payer: Self-pay | Admitting: Family Medicine

## 2015-07-14 VITALS — BP 181/117 | HR 87 | Ht 71.0 in | Wt 164.0 lb

## 2015-07-14 DIAGNOSIS — M545 Low back pain, unspecified: Secondary | ICD-10-CM

## 2015-07-14 DIAGNOSIS — I10 Essential (primary) hypertension: Secondary | ICD-10-CM | POA: Diagnosis not present

## 2015-07-14 NOTE — Assessment & Plan Note (Signed)
Poorly Controlled today. This is possibly due to pain. He has been well-controlled previous visits. Plan to follow-up with PCP in a few weeks. He is currently asymptomatic.

## 2015-07-14 NOTE — Assessment & Plan Note (Signed)
Physical therapy is not helpful. Patient had significant improvement injection however was never lasting. Will repeat trial of injection 1. If not lasting about 3 months will refer him for RF ablation of the right SI joint nerves. Return and if not better in one month.

## 2015-07-14 NOTE — Patient Instructions (Signed)
Thank you for coming in today. Call or go to the ER if you develop a large red swollen joint with extreme pain or oozing puss.  Return in 1 month.  Sacroiliac (SI) Joint Injection Patient Information  Description: The sacroiliac joint connects the scrum (very low back and tailbone) to the ilium (a pelvic bone which also forms half of the hip joint).  Normally this joint experiences very little motion.  When this joint becomes inflamed or unstable low back and or hip and pelvis pain may result.  Injection of this joint with local anesthetics (numbing medicines) and steroids can provide diagnostic information and reduce pain.  This injection is performed with the aid of x-ray guidance into the tailbone area while you are lying on your stomach.   You may experience an electrical sensation down the leg while this is being done.  You may also experience numbness.  We also may ask if we are reproducing your normal pain during the injection.  Conditions which may be treated SI injection:   Low back, buttock, hip or leg pain  Preparation for the Injection:  1. Do not eat any solid food or dairy products within 6 hours of your appointment.  2. You may drink clear liquids up to 2 hours before appointment.  Clear liquids include water, black coffee, juice or soda.  No milk or cream please. 3. You may take your regular medications, including pain medications with a sip of water before your appointment.  Diabetics should hold regular insulin (if take separately) and take 1/2 normal NPH dose the morning of the procedure.  Carry some sugar containing items with you to your appointment. 4. A driver must accompany you and be prepared to drive you home after your procedure. 5. Bring all of your current medications with you. 6. An IV may be inserted and sedation may be given at the discretion of the physician. 7. A blood pressure cuff, EKG and other monitors will often be applied during the procedure.  Some  patients may need to have extra oxygen administered for a short period.  8. You will be asked to provide medical information, including your allergies, prior to the procedure.  We must know immediately if you are taking blood thinners (like Coumadin/Warfarin) or if you are allergic to IV iodine contrast (dye).  We must know if you could possible be pregnant.  Possible side effects:   Bleeding from needle site  Infection (rare, may require surgery)  Nerve injury (rare)  Numbness & tingling (temporary)  A brief convulsion or seizure  Light-headedness (temporary)  Pain at injection site (several days)  Decreased blood pressure (temporary)  Weakness in the leg (temporary)   Call if you experience:   New onset weakness or numbness of an extremity below the injection site that last more than 8 hours.  Hives or difficulty breathing ( go to the emergency room)  Inflammation or drainage at the injection site  Any new symptoms which are concerning to you  Please note:  Although the local anesthetic injected can often make your back/ hip/ buttock/ leg feel good for several hours after the injections, the pain will likely return.  It takes 3-7 days for steroids to work in the sacroiliac area.  You may not notice any pain relief for at least that one week.  If effective, we will often do a series of three injections spaced 3-6 weeks apart to maximally decrease your pain.  After the initial series, we generally  will wait some months before a repeat injection of the same type.  If you have any questions, please call [phone] [facility]

## 2015-07-14 NOTE — Progress Notes (Signed)
Derek Lopez is a 54 y.o. male who presents to [facility]: [facility]  today for back pain. Patient continues to have chronic right-sided low back pain. One month ago he was seen and had a diagnostic and therapeutic right SI joint injection. This provided lasting relief for about 5 weeks. He notes the pain has returned. He denies significant radiating pain weakness or numbness. Additionally he was found to have high blood pressure today. He denies any chest pains palpitations or shortness of breath. He has been taking his blood pressure medications. He states that he has a follow-up appointment with his PCP soon. Patient notes that physical therapy has not been helpful at all.   No past medical history on file. Past Surgical History  Procedure Laterality Date  . Appendectomy    . Tonsillectomy     Social History  Substance Use Topics  . Smoking status: Current Every Day Smoker -- 1.00 packs/day    Types: Cigarettes  . Smokeless tobacco: Not on file  . Alcohol Use: 29.4 oz/week    49 Cans of beer per week   family history includes Cancer in his mother.  ROS as above Medications: Current Outpatient Prescriptions  Medication Sig Dispense Refill  . albuterol (PROVENTIL HFA;VENTOLIN HFA) 108 (90 BASE) MCG/ACT inhaler Inhale 2 puffs into the lungs every 6 (six) hours as needed for wheezing or shortness of breath. 1 Inhaler 1  . Fluticasone Furoate-Vilanterol (BREO ELLIPTA) 100-25 MCG/INH AEPB Inhale 1 puff into the lungs daily. 60 each 6  . metoprolol succinate (TOPROL-XL) 50 MG 24 hr tablet Take 1 tablet (50 mg total) by mouth 2 (two) times daily. Take with or immediately following a meal. 180 tablet 0   No current facility-administered medications for this visit.   Allergies  Allergen Reactions  . Penicillins     REACTION: rash     Exam:  BP 181/117 mmHg  Pulse 87  Ht 5\' 11"  (1.803 m)  Wt 164 lb (74.39 kg)  BMI 22.88 kg/m2 Gen: Well NAD HEENT: EOMI,   MMM Lungs: Normal work of breathing. CTABL Heart: RRR no MRG Abd: NABS, Soft. Nondistended, Nontender Exts: Brisk capillary refill, warm and well perfused.  Back: Tender to palpation right SI joint. Patient walks with a cane with a broad-based gait. Lower extremity strength is intact. Sensation is intact throughout.  Procedure: Real-time Ultrasound Guided Injection of right SI joint  Device: GE Logiq E  Images permanently stored and available for review in the ultrasound unit. Verbal informed consent obtained. Discussed risks and benefits of procedure. Warned about infection bleeding damage to structures skin hypopigmentation and fat atrophy among others. Patient expresses understanding and agreement Time-out conducted.  Noted no overlying erythema, induration, or other signs of local infection.  Skin prepped in a sterile fashion.  Local anesthesia: Topical Ethyl chloride.  With sterile technique and under real time ultrasound guidance: 40 mg of Kenalog and 4 mL of Marcaine injected easily.  Completed without difficulty  Pain immediately resolved suggesting accurate placement of the medication.  Advised to call if fevers/chills, erythema, induration, drainage, or persistent bleeding.  Images permanently stored and available for review in the ultrasound unit.  Impression: Technically successful ultrasound guided injection.    No results found for this or any previous visit (from the past 24 hour(s)). No results found.   Please see individual assessment and plan sections.

## 2015-08-04 ENCOUNTER — Encounter: Payer: Self-pay | Admitting: Family Medicine

## 2015-08-04 ENCOUNTER — Ambulatory Visit (INDEPENDENT_AMBULATORY_CARE_PROVIDER_SITE_OTHER): Payer: 59 | Admitting: Family Medicine

## 2015-08-04 VITALS — BP 126/95 | HR 77 | Temp 97.7°F | Ht 71.0 in | Wt 159.0 lb

## 2015-08-04 DIAGNOSIS — J45909 Unspecified asthma, uncomplicated: Secondary | ICD-10-CM

## 2015-08-04 DIAGNOSIS — I10 Essential (primary) hypertension: Secondary | ICD-10-CM | POA: Diagnosis not present

## 2015-08-04 DIAGNOSIS — Z23 Encounter for immunization: Secondary | ICD-10-CM | POA: Diagnosis not present

## 2015-08-04 DIAGNOSIS — J449 Chronic obstructive pulmonary disease, unspecified: Secondary | ICD-10-CM

## 2015-08-04 LAB — COMPLETE METABOLIC PANEL WITH GFR
ALT: 43 U/L (ref 9–46)
AST: 61 U/L — ABNORMAL HIGH (ref 10–35)
Albumin: 4 g/dL (ref 3.6–5.1)
Alkaline Phosphatase: 139 U/L — ABNORMAL HIGH (ref 40–115)
BUN: 4 mg/dL — AB (ref 7–25)
CO2: 29 mmol/L (ref 20–31)
Calcium: 9 mg/dL (ref 8.6–10.3)
Chloride: 96 mmol/L — ABNORMAL LOW (ref 98–110)
Creat: 0.63 mg/dL — ABNORMAL LOW (ref 0.70–1.33)
GFR, Est African American: >89 mL/min (ref >=60)
Glucose, Bld: 88 mg/dL (ref 65–99)
Potassium: 4 mmol/L (ref 3.5–5.3)
SODIUM: 133 mmol/L — AB (ref 135–146)
Total Bilirubin: 0.8 mg/dL (ref 0.2–1.2)
Total Protein: 6.8 g/dL (ref 6.1–8.1)

## 2015-08-04 LAB — LIPID PANEL
CHOL/HDL RATIO: 2.4 ratio (ref <=5.0)
Cholesterol: 225 mg/dL — ABNORMAL HIGH (ref 125–200)
HDL: 94 mg/dL (ref >=40)
LDL CALC: 116 mg/dL (ref <130)
Triglycerides: 76 mg/dL (ref <150)
VLDL: 15 mg/dL (ref <30)

## 2015-08-04 MED ORDER — AMLODIPINE BESYLATE 5 MG PO TABS: 5.0000 mg | ORAL_TABLET | Freq: Every day | ORAL | Status: DC

## 2015-08-04 NOTE — Progress Notes (Signed)
   Subjective:    Patient ID: Derek Lopez, male    DOB: November 22, 1960, 54 y.o.   MRN: [id number]  HPI Hypertension- Pt denies chest pain, SOB, dizziness, or heart palpitations.  Taking meds as directed w/o problems.  Denies medication side effects.  He has been seeing Dr. Georgina Snell for his back. His blood pressure has been up several times during this office visits recently.  Follow-up asthma-he's been using the [facility]. Cost has been about $40.  Had to use his albuterol  Once since I last him  Still smoking 1ppd.     Review of Systems     Objective:   Physical Exam  Constitutional: He is oriented to person, place, and time. He appears well-developed and well-nourished.  HENT:  Head: Normocephalic and atraumatic.  Right Ear: External ear normal.  Left Ear: External ear normal.  Nose: Nose normal.  Mouth/Throat: Oropharynx is clear and moist.  Eyes: Conjunctivae and EOM are normal. Pupils are equal, round, and reactive to light.  Neck: Normal range of motion. Neck supple. No thyromegaly present.  Cardiovascular: Normal rate, regular rhythm, normal heart sounds and intact distal pulses.   Pulmonary/Chest: Effort normal and breath sounds normal.  Abdominal: Soft. Bowel sounds are normal. He exhibits no distension and no mass. There is no tenderness. There is no rebound and no guarding.  Musculoskeletal: Normal range of motion.  Lymphadenopathy:    He has no cervical adenopathy.  Neurological: He is alert and oriented to person, place, and time. He has normal reflexes.  Skin: Skin is warm and dry.  Psychiatric: He has a normal mood and affect. His behavior is normal. Judgment and thought content normal.          Assessment & Plan:  COPD/Asthma - doing very well with 30. He easily had to use his rescue inhaler once. He likes the device and feels like overall he is breathing better. This is fantastic. We'll check up again in 4 months and see how he is doing. He did receive his flu vaccine  today.  HTN - will add amlodipine. Follow-up in 4 months. Due for CMP and lipids today.

## 2015-08-06 ENCOUNTER — Ambulatory Visit: Payer: 59 | Admitting: Family Medicine

## 2015-08-10 ENCOUNTER — Encounter: Payer: Self-pay | Admitting: Family Medicine

## 2015-08-11 ENCOUNTER — Encounter: Payer: Self-pay | Admitting: Family Medicine

## 2015-08-11 ENCOUNTER — Ambulatory Visit (INDEPENDENT_AMBULATORY_CARE_PROVIDER_SITE_OTHER): Payer: 59 | Admitting: Family Medicine

## 2015-08-11 VITALS — BP 139/91 | HR 70 | Wt 164.0 lb

## 2015-08-11 DIAGNOSIS — M545 Low back pain, unspecified: Secondary | ICD-10-CM

## 2015-08-11 NOTE — Patient Instructions (Signed)
Thank you for coming in today. Return in late December.

## 2015-08-11 NOTE — Assessment & Plan Note (Signed)
Doing better. Continue SI injections every 3 months as needed for pain. It becoming more frequent would consider radiofrequency ablation to the right SI joint.

## 2015-08-11 NOTE — Progress Notes (Signed)
Derek Lopez is a 54 y.o. male who presents to [facility]  today for back pain. Patient presents to clinic today to follow-up his right SI joint pain. He received ultrasound-guided SI injection on September 21. He notes his pain is moderately improved. He still has some pain but overall is feeling better. He continues his home exercises. Significant radiating pain weakness or numbness fevers or chills.   No past medical history on file. Past Surgical History  Procedure Laterality Date  . Appendectomy    . Tonsillectomy     Social History  Substance Use Topics  . Smoking status: Current Every Day Smoker -- 1.00 packs/day    Types: Cigarettes  . Smokeless tobacco: Not on file  . Alcohol Use: 29.4 oz/week    49 Cans of beer per week   family history includes Cancer in his mother.  ROS as above Medications: Current Outpatient Prescriptions  Medication Sig Dispense Refill  . albuterol (PROVENTIL HFA;VENTOLIN HFA) 108 (90 BASE) MCG/ACT inhaler Inhale 2 puffs into the lungs every 6 (six) hours as needed for wheezing or shortness of breath. 1 Inhaler 1  . amLODipine (NORVASC) 5 MG tablet Take 1 tablet (5 mg total) by mouth daily. 30 tablet 5  . Fluticasone Furoate-Vilanterol (BREO ELLIPTA) 100-25 MCG/INH AEPB Inhale 1 puff into the lungs daily. 60 each 6  . metoprolol succinate (TOPROL-XL) 50 MG 24 hr tablet Take 1 tablet (50 mg total) by mouth 2 (two) times daily. Take with or immediately following a meal. 180 tablet 0   No current facility-administered medications for this visit.   Allergies  Allergen Reactions  . Penicillins     REACTION: rash     Exam:  BP 139/91 mmHg  Pulse 70  Wt 164 lb (74.39 kg) Gen: Well NAD Back: Tender to palpation right SI joint. Antalgic gait using a cane.  No results found for this or any previous visit (from the past 24 hour(s)). No results found.   Please see individual assessment and plan  sections.

## 2015-09-08 ENCOUNTER — Other Ambulatory Visit: Payer: Self-pay | Admitting: Family Medicine

## 2015-10-15 ENCOUNTER — Ambulatory Visit (INDEPENDENT_AMBULATORY_CARE_PROVIDER_SITE_OTHER): Payer: 59 | Admitting: Family Medicine

## 2015-10-15 DIAGNOSIS — M533 Sacrococcygeal disorders, not elsewhere classified: Secondary | ICD-10-CM

## 2015-10-15 DIAGNOSIS — G8929 Other chronic pain: Secondary | ICD-10-CM

## 2015-10-15 DIAGNOSIS — I700 Atherosclerosis of aorta: Secondary | ICD-10-CM

## 2015-10-15 NOTE — Assessment & Plan Note (Signed)
Chronic SI joint pain. Injections every 3 months as needed. Injections today were immediately helpful. Return as needed.

## 2015-10-15 NOTE — Progress Notes (Signed)
Derek Lopez is a 54 y.o. male who presents to [facility]: [facility] today for follow-up back pain. Patient was last seen in October for chronic low back pain thought to be related to SI joint dysfunction as well as myofascial dysfunction and pain. He received SI joint injections which have helped a lot however did not last the full 3 months. He would like injections today if possible. He denies any new injury radiating pain weakness or numbness. His current pain is moderate and worse with activity. He notes that physical therapy did not help much.   No past medical history on file. Past Surgical History  Procedure Laterality Date  . Appendectomy    . Tonsillectomy     Social History  Substance Use Topics  . Smoking status: Current Every Day Smoker -- 1.00 packs/day    Types: Cigarettes  . Smokeless tobacco: Not on file  . Alcohol Use: 29.4 oz/week    49 Cans of beer per week   family history includes Cancer in his mother.  ROS as above Medications: Current Outpatient Prescriptions  Medication Sig Dispense Refill  . albuterol (PROVENTIL HFA;VENTOLIN HFA) 108 (90 BASE) MCG/ACT inhaler Inhale 2 puffs into the lungs every 6 (six) hours as needed for wheezing or shortness of breath. 1 Inhaler 1  . amLODipine (NORVASC) 5 MG tablet Take 1 tablet (5 mg total) by mouth daily. 30 tablet 5  . Fluticasone Furoate-Vilanterol (BREO ELLIPTA) 100-25 MCG/INH AEPB Inhale 1 puff into the lungs daily. 60 each 6  . metoprolol succinate (TOPROL-XL) 50 MG 24 hr tablet TAKE 1 TABLET BY MOUTH 2  TIMES DAILY. TAKE WITH OR  IMMEDIATELY FOLLOWING A  MEAL. 180 tablet 0   No current facility-administered medications for this visit.   Allergies  Allergen Reactions  . Penicillins     REACTION: rash     Exam:  There were no vitals taken for this visit. Gen: Well NAD Back: Nontender to midline. Tender  palpation bilateral SI joints. Low back range of motion is limited in extension. Normal flexion. Antalgic gait present. Lower extremity strength is intact throughout.  Procedure: Real-time Ultrasound Guided Injection of right SI joint  Device: GE Logiq E  Images permanently stored and available for review in the ultrasound unit. Verbal informed consent obtained. Discussed risks and benefits of procedure. Warned about infection bleeding damage to structures skin hypopigmentation and fat atrophy among others. Patient expresses understanding and agreement Time-out conducted.  Noted no overlying erythema, induration, or other signs of local infection.  Skin prepped in a sterile fashion.  Local anesthesia: Topical Ethyl chloride.  With sterile technique and under real time ultrasound guidance: 40 mg of Kenalog and 4 mL of Marcaine injected easily.  Completed without difficulty  Pain immediately resolved suggesting accurate placement of the medication.  Advised to call if fevers/chills, erythema, induration, drainage, or persistent bleeding.  Images permanently stored and available for review in the ultrasound unit.  Impression: Technically successful ultrasound guided injection.   Procedure: Real-time Ultrasound Guided Injection of left SI joint  Device: GE Logiq E  Images permanently stored and available for review in the ultrasound unit. Verbal informed consent obtained. Discussed risks and benefits of procedure. Warned about infection bleeding damage to structures skin hypopigmentation and fat atrophy among others. Patient expresses understanding and agreement Time-out conducted.  Noted no overlying erythema, induration, or other signs of local infection.  Skin prepped in a sterile fashion.  Local anesthesia:  Topical Ethyl chloride.  With sterile technique and under real time ultrasound guidance: 40 mg of Kenalog and 4 mL of Marcaine injected easily.  Completed without  difficulty  Pain immediately resolved suggesting accurate placement of the medication.  Advised to call if fevers/chills, erythema, induration, drainage, or persistent bleeding.  Images permanently stored and available for review in the ultrasound unit.  Impression: Technically successful ultrasound guided injection.    No results found for this or any previous visit (from the past 24 hour(s)). No results found.   Please see individual assessment and plan sections.

## 2015-10-15 NOTE — Patient Instructions (Signed)
Thank you for coming in today. Call or go to the ER if you develop a large red swollen joint with extreme pain or oozing puss.  Return in 3 months or so.   Come back or go to the emergency room if you notice new weakness new numbness problems walking or bowel or bladder problems.

## 2015-11-15 ENCOUNTER — Other Ambulatory Visit: Payer: Self-pay | Admitting: Family Medicine

## 2015-12-09 ENCOUNTER — Ambulatory Visit (INDEPENDENT_AMBULATORY_CARE_PROVIDER_SITE_OTHER): Payer: 59 | Admitting: Family Medicine

## 2015-12-09 ENCOUNTER — Encounter: Payer: Self-pay | Admitting: Family Medicine

## 2015-12-09 VITALS — BP 132/78 | HR 80 | Ht 71.0 in | Wt 161.0 lb

## 2015-12-09 DIAGNOSIS — I10 Essential (primary) hypertension: Secondary | ICD-10-CM | POA: Diagnosis not present

## 2015-12-09 DIAGNOSIS — J4540 Moderate persistent asthma, uncomplicated: Secondary | ICD-10-CM

## 2015-12-09 DIAGNOSIS — Z1211 Encounter for screening for malignant neoplasm of colon: Secondary | ICD-10-CM

## 2015-12-09 MED ORDER — METOPROLOL SUCCINATE ER 200 MG PO TB24: 200.0000 mg | ORAL_TABLET | Freq: Every day | ORAL | Status: DC

## 2015-12-09 MED ORDER — FLUTICASONE FUROATE-VILANTEROL 100-25 MCG/INH IN AEPB: 1.0000 | INHALATION_SPRAY | Freq: Every day | RESPIRATORY_TRACT | Status: DC

## 2015-12-09 NOTE — Patient Instructions (Signed)
If the cost of the metoprolol 200mg  XL is too much then have the pharmacy call us and we can change to the 100mg  XL twice a day for a total of 200mg .

## 2015-12-09 NOTE — Progress Notes (Signed)
   Subjective:    Patient ID: Derek Lopez, male    DOB: [birth year], 55 y.o.   MRN: [id number]  HPI Hypertension- Pt denies chest pain, SOB, dizziness, or heart palpitations.  Taking meds as directed w/o problems.  Denies medication side effects.    Asthma - he is on Breo daily in AM.  Using albuterol PRN. Has only used twice in the last couple months.    Review of Systems     Objective:   Physical Exam  Constitutional: He is oriented to person, place, and time. He appears well-developed and well-nourished.  HENT:  Head: Normocephalic and atraumatic.  Cardiovascular: Normal rate, regular rhythm and normal heart sounds.   Pulmonary/Chest: Effort normal and breath sounds normal. He has no wheezes.  ppor air movement. No wheezing  Neurological: He is alert and oriented to person, place, and time.  Skin: Skin is warm and dry.  Psychiatric: He has a normal mood and affect. His behavior is normal.        Assessment & Plan:  HTN - will inc metoprolol.  F/U in 6 weeks.    Asthma - doing well on current regimen. Encourage smoking cessation.   Colon Ca screening - will do Cologuard. Declines scopy. Form faxed.

## 2015-12-10 LAB — BASIC METABOLIC PANEL
BUN: 3 mg/dL — AB (ref 7–25)
CHLORIDE: 91 mmol/L — AB (ref 98–110)
CO2: 26 mmol/L (ref 20–31)
Calcium: 9 mg/dL (ref 8.6–10.3)
Creat: 0.52 mg/dL — ABNORMAL LOW (ref 0.70–1.33)
Glucose, Bld: 91 mg/dL (ref 65–99)
Potassium: 4.1 mmol/L (ref 3.5–5.3)
Sodium: 127 mmol/L — ABNORMAL LOW (ref 135–146)

## 2016-01-11 ENCOUNTER — Encounter: Payer: Self-pay | Admitting: Family Medicine

## 2016-01-11 ENCOUNTER — Ambulatory Visit (INDEPENDENT_AMBULATORY_CARE_PROVIDER_SITE_OTHER): Payer: 59 | Admitting: Family Medicine

## 2016-01-11 VITALS — BP 109/52 | HR 75 | Wt 162.0 lb

## 2016-01-11 DIAGNOSIS — N400 Enlarged prostate without lower urinary tract symptoms: Secondary | ICD-10-CM | POA: Diagnosis not present

## 2016-01-11 DIAGNOSIS — Z0000 Encounter for general adult medical examination without abnormal findings: Secondary | ICD-10-CM

## 2016-01-11 DIAGNOSIS — F1020 Alcohol dependence, uncomplicated: Secondary | ICD-10-CM | POA: Diagnosis not present

## 2016-01-11 DIAGNOSIS — IMO0001 Reserved for inherently not codable concepts without codable children: Secondary | ICD-10-CM

## 2016-01-11 DIAGNOSIS — J410 Simple chronic bronchitis: Secondary | ICD-10-CM

## 2016-01-11 NOTE — Progress Notes (Signed)
Subjective:    Patient ID: Derek Lopez, male    DOB: [birth year], 55 y.o.   MRN: [id number]  HPI  Here today for complete physical exam. He has no specific concerns or complaints. Just wants to get preventative services completed.  He did mention that he urinates frequently. He says about every 30 minutes during the day but only gets up once at night to urinate. He is not very physically active.   Review of Systems  BP 145/91 mmHg  Pulse 75  Wt 162 lb (73.483 kg)  SpO2 95%    Allergies  Allergen Reactions  . Penicillins     REACTION: rash    No past medical history on file.  Past Surgical History  Procedure Laterality Date  . Appendectomy    . Tonsillectomy      Social History   Social History  . Marital Status: Married    Spouse Name: N/A  . Number of Children: 2  . Years of Education: N/A   Occupational History  . Retired    Social History Main Topics  . Smoking status: Current Every Day Smoker -- 1.00 packs/day    Types: Cigarettes  . Smokeless tobacco: Not on file  . Alcohol Use: 29.4 oz/week    49 Cans of beer per week  . Drug Use: No  . Sexual Activity:    Partners: Female   Other Topics Concern  . Not on file   Social History Narrative   Says walk daily.  .  Drink coffe in the AM, 2 cups.      Family History  Problem Relation Age of Onset  . Cancer Mother     Outpatient Encounter Prescriptions as of 01/11/2016  Medication Sig  . albuterol (PROVENTIL HFA;VENTOLIN HFA) 108 (90 BASE) MCG/ACT inhaler Inhale 2 puffs into the lungs every 6 (six) hours as needed for wheezing or shortness of breath.  Marland Kitchen amLODipine (NORVASC) 5 MG tablet Take 1 tablet (5 mg total) by mouth daily.  . fluticasone furoate-vilanterol (BREO ELLIPTA) 100-25 MCG/INH AEPB Inhale 1 puff into the lungs daily.  . metoprolol succinate (TOPROL-XL) 200 MG 24 hr tablet Take 1 tablet (200 mg total) by mouth at bedtime. Take with or immediately following a meal.   No  facility-administered encounter medications on file as of 01/11/2016.          Objective:   Physical Exam  Constitutional: He is oriented to person, place, and time. He appears well-developed and well-nourished.  HENT:  Head: Normocephalic and atraumatic.  Right Ear: External ear normal.  Left Ear: External ear normal.  Nose: Nose normal.  Mouth/Throat: Oropharynx is clear and moist.  phymosis of the nose.  Eyes: Conjunctivae and EOM are normal. Pupils are equal, round, and reactive to light.  Neck: Normal range of motion. Neck supple. No thyromegaly present.  Cardiovascular: Normal rate, regular rhythm, normal heart sounds and intact distal pulses.   Pulmonary/Chest: Effort normal. He has wheezes.  Diffuse inspiratory and expiratory wheezing on exam today.  Abdominal: Soft. Bowel sounds are normal. He exhibits no distension and no mass. There is no tenderness. There is no rebound and no guarding.  Genitourinary: Guaiac positive stool.  Prostate is enlarged. Slightly larger on the right compared to left but no distinct nodules.  Musculoskeletal: Normal range of motion.  Lymphadenopathy:    He has no cervical adenopathy.  Neurological: He is alert and oriented to person, place, and time. He has normal reflexes.  Skin: Skin is  warm and dry.  Several telangiectasias on the upper chest and shoulders.  Psychiatric: He has a normal mood and affect. His behavior is normal. Judgment and thought content normal.          Assessment & Plan:  CPE Keep up a regular exercise program and make sure you are eating a healthy diet Try to eat 4 servings of dairy a day, or if you are lactose intolerant take a calcium with vitamin D daily.  Your vaccines are up to date.   BPH- On exam prostate was enlarged. Had him also complete an AUA questionnaire today.We'll recheck PSA. Can discuss treatment options if PSA is within the normal range.  Colon cancer screening-he does have the color guard  Home he just hasn't returned yet. Strongly encouraged him to get that done especially since the quiet test was positive today.  Asthma-he says he is currently using his. Daily. He has not used his albuterol months. I encouraged him to use it for the next couple of days to see if this helps with his wheezing and improved breathing. He is very inactive and thus does not feel symptomatic even though his lung exam is abnormal today.  He declines colon cancer screening.

## 2016-01-12 LAB — COMPLETE METABOLIC PANEL WITH GFR
ALBUMIN: 4 g/dL (ref 3.6–5.1)
ALK PHOS: 100 U/L (ref 40–115)
ALT: 37 U/L (ref 9–46)
AST: 71 U/L — AB (ref 10–35)
BUN: 4 mg/dL — AB (ref 7–25)
CALCIUM: 8.8 mg/dL (ref 8.6–10.3)
CO2: 22 mmol/L (ref 20–31)
Chloride: 91 mmol/L — ABNORMAL LOW (ref 98–110)
Creat: 0.52 mg/dL — ABNORMAL LOW (ref 0.70–1.33)
GFR, Est African American: >89 mL/min (ref >=60)
GFR, Est Non African American: >89 mL/min (ref >=60)
Glucose, Bld: 94 mg/dL (ref 65–99)
POTASSIUM: 4.2 mmol/L (ref 3.5–5.3)
Sodium: 127 mmol/L — ABNORMAL LOW (ref 135–146)
Total Bilirubin: 1 mg/dL (ref 0.2–1.2)
Total Protein: 6.7 g/dL (ref 6.1–8.1)

## 2016-01-12 LAB — LIPID PANEL
CHOL/HDL RATIO: 2.2 ratio (ref <=5.0)
CHOLESTEROL: 200 mg/dL (ref 125–200)
HDL: 91 mg/dL (ref >=40)
LDL Cholesterol: 96 mg/dL (ref <130)
Triglycerides: 65 mg/dL (ref <150)
VLDL: 13 mg/dL (ref <30)

## 2016-01-12 LAB — PSA: PSA: 1.48 ng/mL (ref <=4.00)

## 2016-01-13 ENCOUNTER — Other Ambulatory Visit: Payer: Self-pay | Admitting: Family Medicine

## 2016-01-13 DIAGNOSIS — K869 Disease of pancreas, unspecified: Secondary | ICD-10-CM

## 2016-01-14 ENCOUNTER — Other Ambulatory Visit: Payer: 59

## 2016-01-14 ENCOUNTER — Ambulatory Visit (INDEPENDENT_AMBULATORY_CARE_PROVIDER_SITE_OTHER): Payer: 59

## 2016-01-14 DIAGNOSIS — K8020 Calculus of gallbladder without cholecystitis without obstruction: Secondary | ICD-10-CM

## 2016-01-14 DIAGNOSIS — K219 Gastro-esophageal reflux disease without esophagitis: Secondary | ICD-10-CM

## 2016-01-14 DIAGNOSIS — K869 Disease of pancreas, unspecified: Secondary | ICD-10-CM

## 2016-01-14 MED ORDER — TAMSULOSIN HCL 0.4 MG PO CAPS: 0.4000 mg | ORAL_CAPSULE | Freq: Every day | ORAL | Status: DC

## 2016-01-14 MED ORDER — IOHEXOL 350 MG/ML SOLN: 100.0000 mL | Freq: Once | INTRAVENOUS | Status: DC | PRN

## 2016-01-14 NOTE — Addendum Note (Signed)
Addended by: [name] D on: 01/14/2016 08:11 AM   Modules accepted: Orders

## 2016-02-03 ENCOUNTER — Other Ambulatory Visit: Payer: Self-pay | Admitting: Family Medicine

## 2016-02-03 LAB — COLOGUARD: COLOGUARD: POSITIVE

## 2016-02-07 ENCOUNTER — Other Ambulatory Visit: Payer: Self-pay | Admitting: *Deleted

## 2016-02-07 ENCOUNTER — Telehealth: Payer: Self-pay | Admitting: Family Medicine

## 2016-02-07 DIAGNOSIS — K639 Disease of intestine, unspecified: Secondary | ICD-10-CM

## 2016-02-07 MED ORDER — AMLODIPINE BESYLATE 5 MG PO TABS: 5.0000 mg | ORAL_TABLET | Freq: Every day | ORAL | Status: DC

## 2016-02-07 NOTE — Telephone Encounter (Signed)
Call pt: Cologuard is positive when means he need to have a colonoscpy to eval for colon cancer. Referral placed.

## 2016-02-08 NOTE — Telephone Encounter (Signed)
Called pt's wife and informed her of referral.Talesha Ellithorpe, [place] Crocker

## 2016-02-09 ENCOUNTER — Encounter: Payer: Self-pay | Admitting: Family Medicine

## 2016-03-01 IMAGING — CR DG SACRUM/COCCYX 2+V
3 series · 3 of 3 positions shown · non-contrast
Comparison: Lumbar spine series of today's date

CLINICAL DATA: 2-3 weeks of right low back pain without known
injury

EXAM:
SACRUM AND COCCYX - 2+ VIEW

[coccyx ap]
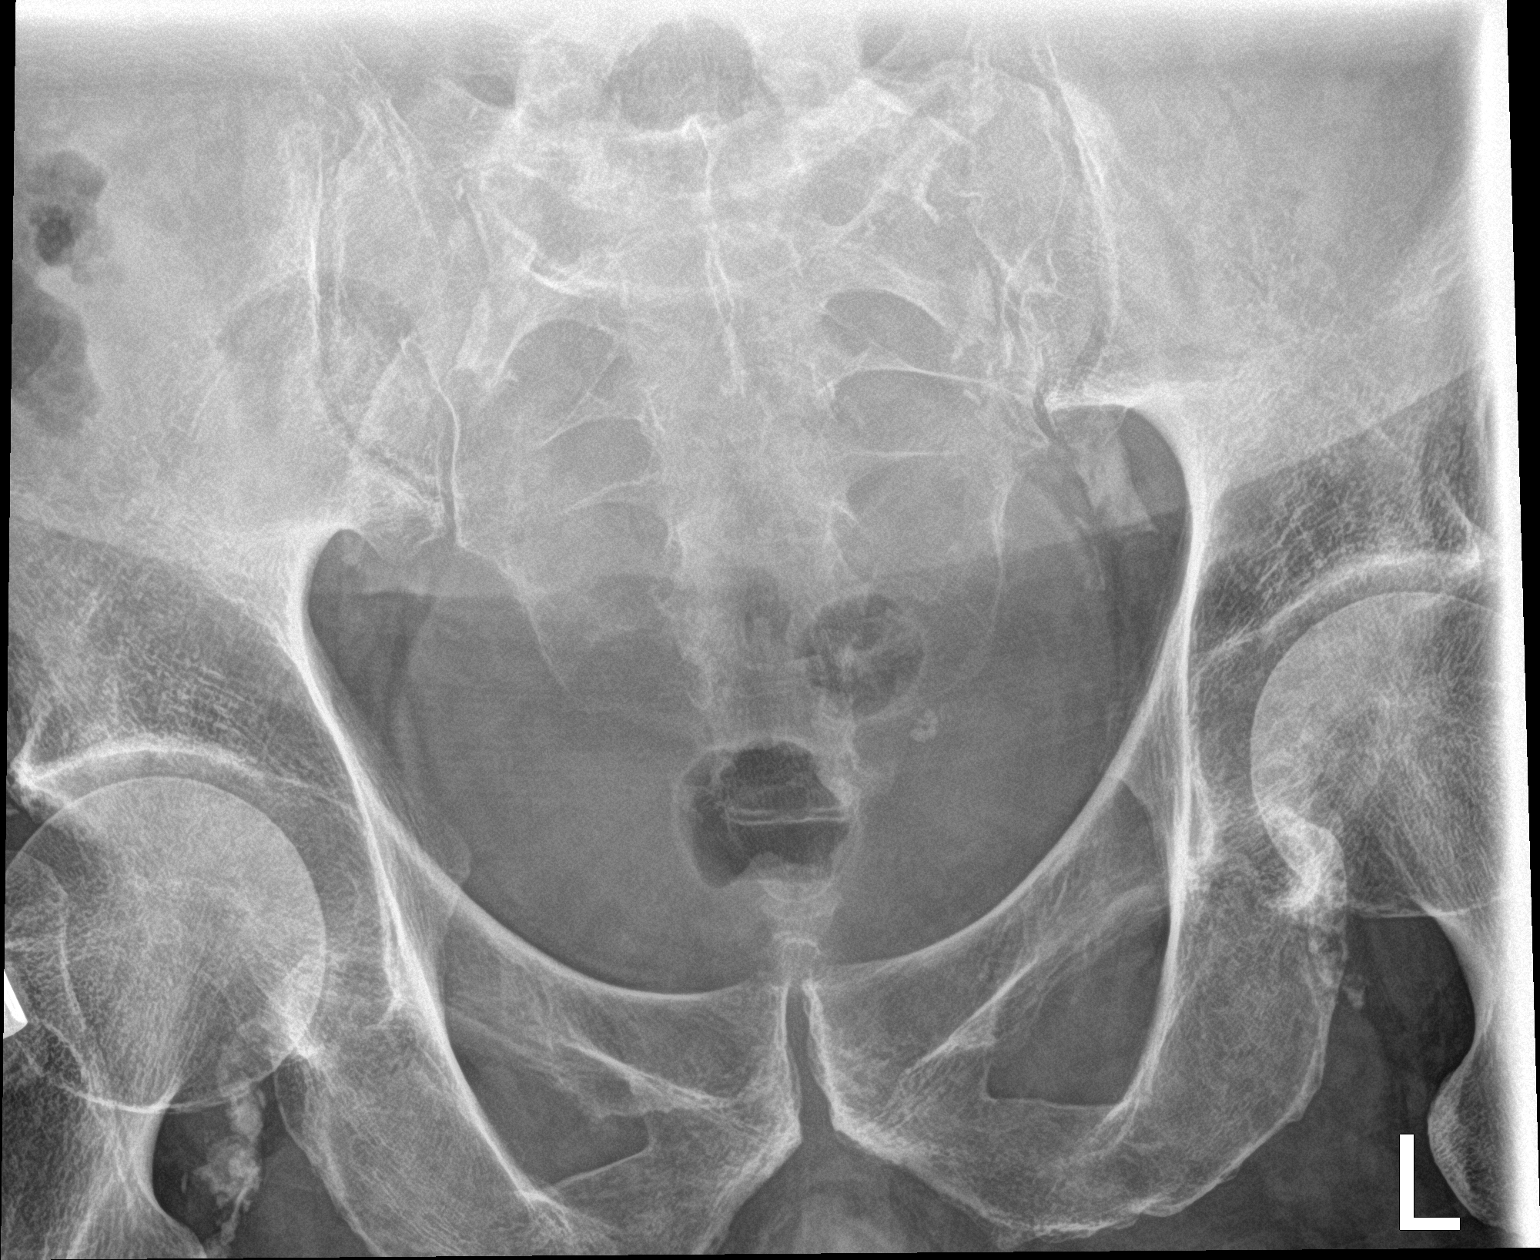

[sacrum ap]
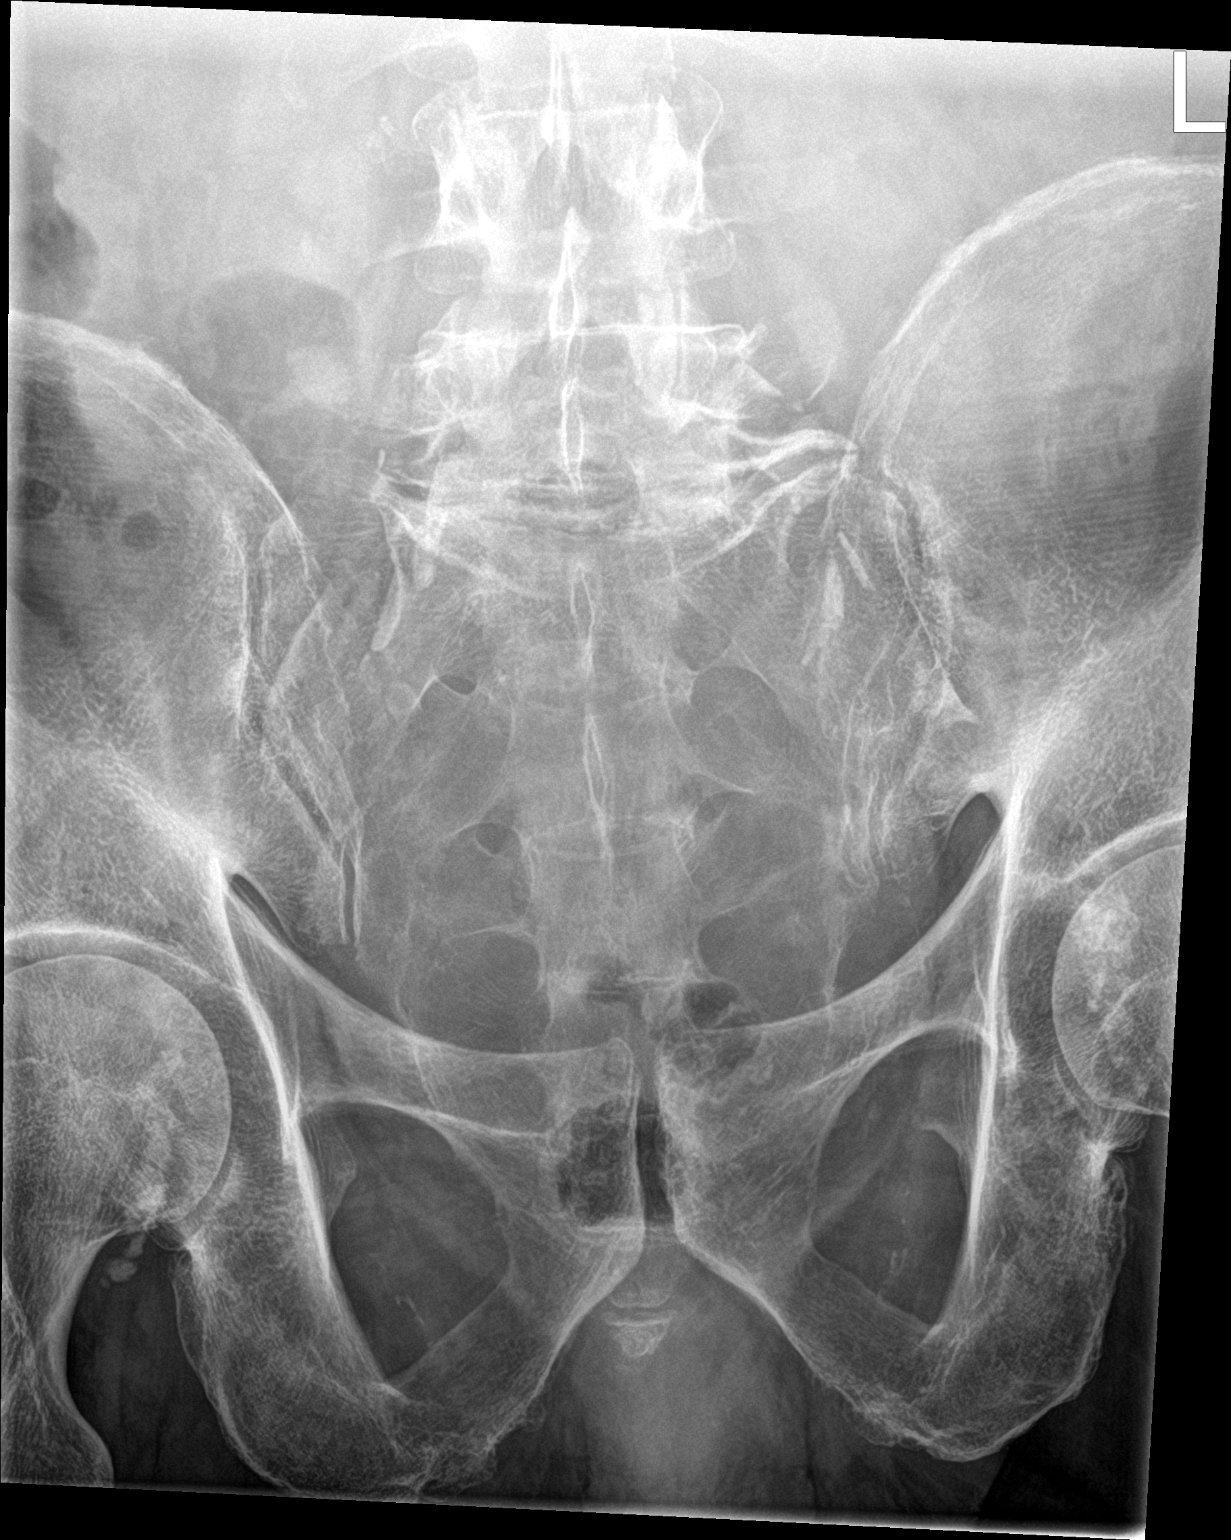

[sacrum lat]
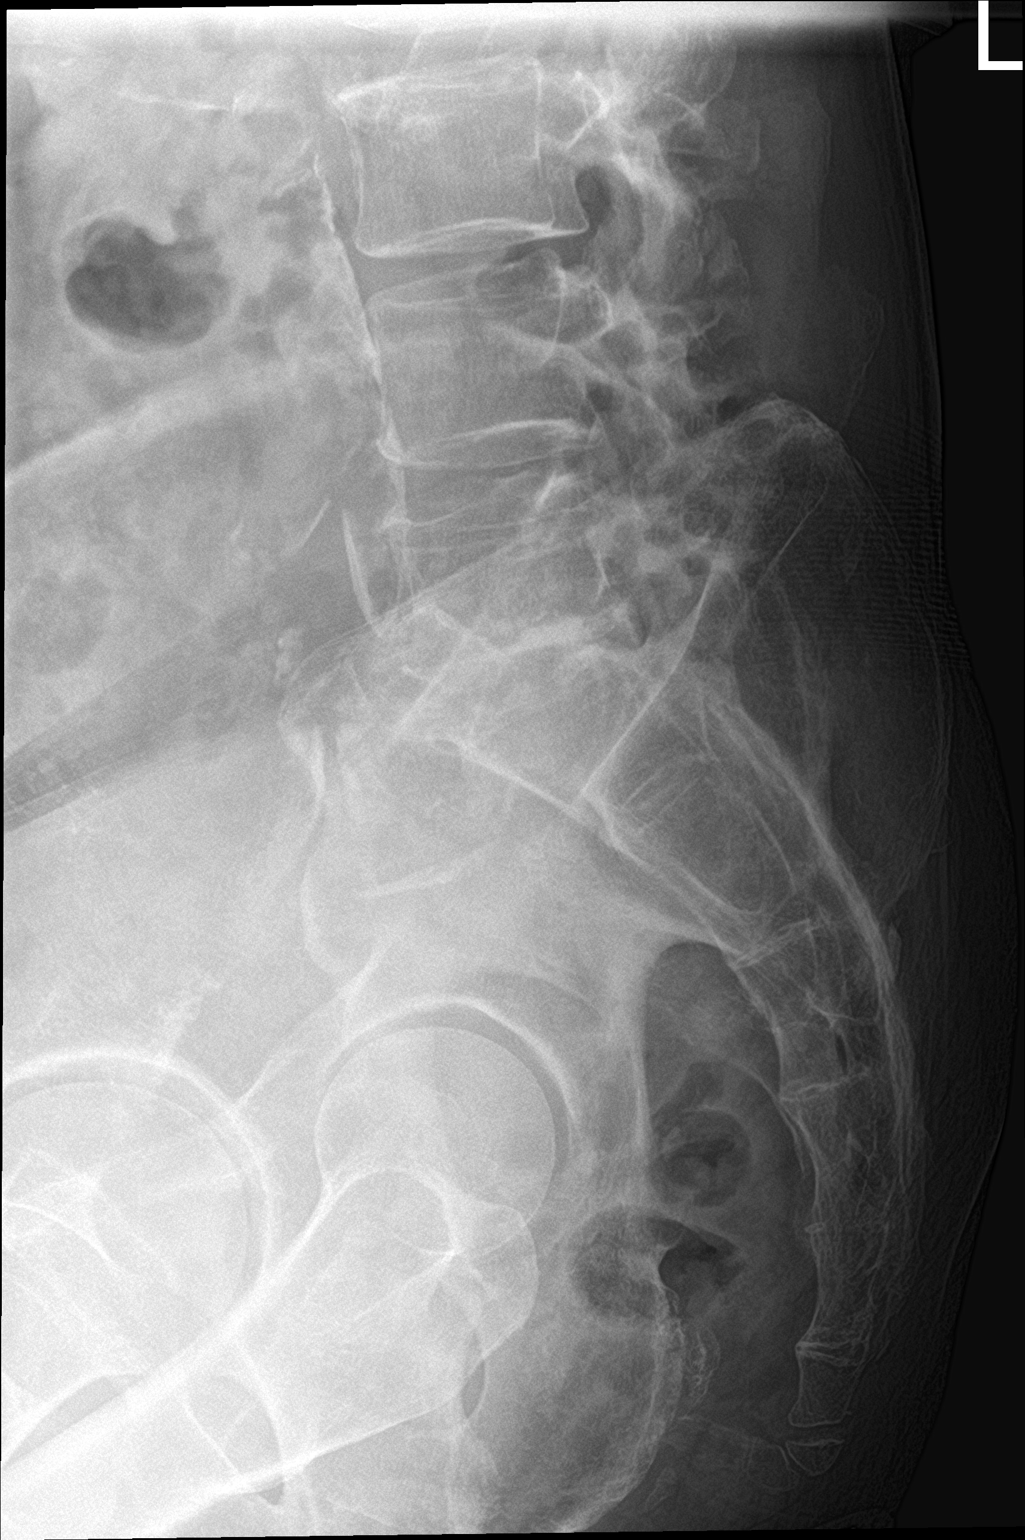

[3 of 3 positions shown; findings below may reference images not displayed]

FINDINGS: The bony pelvis is adequately mineralized. The SI joints are
unremarkable. There are at least 3 intact sacral struts observed.
The lateral film reveals acute angulation of the coccyx both
proximally and distally in a reverse S-shaped configuration..
IMPRESSION: There is no acute bony abnormality of the sacrum. There is
significant angulation of the coccyx which is likely chronic given
the lack of history of injury.

## 2016-03-10 LAB — HM COLONOSCOPY

## 2016-03-14 LAB — HM COLONOSCOPY

## 2016-03-22 ENCOUNTER — Encounter: Payer: Self-pay | Admitting: Family Medicine

## 2016-03-24 ENCOUNTER — Ambulatory Visit (INDEPENDENT_AMBULATORY_CARE_PROVIDER_SITE_OTHER): Payer: 59 | Admitting: Family Medicine

## 2016-03-24 ENCOUNTER — Encounter: Payer: Self-pay | Admitting: Family Medicine

## 2016-03-24 VITALS — BP 164/141 | HR 70 | Temp 98.4°F | Wt 159.0 lb

## 2016-03-24 DIAGNOSIS — K7030 Alcoholic cirrhosis of liver without ascites: Secondary | ICD-10-CM

## 2016-03-24 DIAGNOSIS — R799 Abnormal finding of blood chemistry, unspecified: Secondary | ICD-10-CM | POA: Diagnosis not present

## 2016-03-24 DIAGNOSIS — M7989 Other specified soft tissue disorders: Secondary | ICD-10-CM

## 2016-03-24 DIAGNOSIS — R7989 Other specified abnormal findings of blood chemistry: Secondary | ICD-10-CM

## 2016-03-24 LAB — COMPLETE METABOLIC PANEL WITH GFR
ALT: 19 U/L (ref 9–46)
AST: 40 U/L — ABNORMAL HIGH (ref 10–35)
Albumin: 3.8 g/dL (ref 3.6–5.1)
Alkaline Phosphatase: 147 U/L — ABNORMAL HIGH (ref 40–115)
BUN: 5 mg/dL — ABNORMAL LOW (ref 7–25)
CHLORIDE: 99 mmol/L (ref 98–110)
CO2: 25 mmol/L (ref 20–31)
CREATININE: 0.6 mg/dL — AB (ref 0.70–1.33)
Calcium: 9.2 mg/dL (ref 8.6–10.3)
Glucose, Bld: 93 mg/dL (ref 65–99)
Potassium: 4.4 mmol/L (ref 3.5–5.3)
Sodium: 134 mmol/L — ABNORMAL LOW (ref 135–146)
Total Bilirubin: 0.7 mg/dL (ref 0.2–1.2)
Total Protein: 6.8 g/dL (ref 6.1–8.1)

## 2016-03-24 LAB — TSH: TSH: 2.11 mIU/L (ref 0.40–4.50)

## 2016-03-24 MED ORDER — AMLODIPINE BESYLATE 2.5 MG PO TABS: 2.5000 mg | ORAL_TABLET | Freq: Every day | ORAL | Status: DC

## 2016-03-24 MED ORDER — TRAMADOL HCL 50 MG PO TABS: 50.0000 mg | ORAL_TABLET | Freq: Three times a day (TID) | ORAL | Status: DC | PRN

## 2016-03-24 NOTE — Progress Notes (Signed)
Subjective:    CC: Feet Swelling.   HPI: Patient comes in today complaining of bilateral feet swelling it's been going on for about 3 weeks. It will come and go after a couple of days. It does feel uncomfortable and painful when they do swell and it's painful to walk. He's never had this happen before. He denies any chest pain, shortness of breath, palpitations or fevers or chills. They did try Epson salt soaks. He's also been trying to keep them elevated above heart level.  He is currently on amlodipine 5mg .  We did recently increase the metoprolol to 200 mg. And he started the Flomax for his prostate several months ago as well.  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 They did recommend he stay way from all NSAIDs and Tylenol products. The GI had recommended that primary care she something for pain relief. So that he can use for his occasional back and hip pain and migraine headaches.  Past medical history, Surgical history, Family history not pertinant except as noted below, Social history, Allergies, and medications have been entered into the medical record, reviewed, and corrections made.   Review of Systems: No fevers, chills, night sweats, weight loss, chest pain, or shortness of breath.   Objective:    General: Well Developed, well nourished, and in no acute distress.  Neuro: Alert and oriented x3, extra-ocular muscles intact, sensation grossly intact.  HEENT: Normocephalic, atraumatic  Skin: Warm and dry, no rashes. Cardiac: Regular rate and rhythm, no murmurs rubs or gallops, no lower extremity edema.  Respiratory: Clear to auscultation bilaterally. Not using accessory muscles, speaking in full sentences. Lower extremities: He does have significant edema of both feet. Right greater than the left. Trace ankle edema on the left. Dorsal pedal pulse 2+ bilaterally. Ankles with normal range of motion. No tenderness or swelling of the calf  muscles.   Impression and Recommendations:   Bilateral feet swelling-unclear etiology. I'm most suspicious of the amlodipine. We will go ahead and drop the dose to 2.5 mg.  Also blood pressure is still not well controlled so certainly this could be contributing. No signs of congestive heart failure on exam. But we'll check a BMP. We'll also check a thyroid level as well as recheck electrolytes for protein loss. We'll also check urinalysis for proteinuria. Consider adding a diuretic to improve symptoms.   Cirrhosis-we will try tramadol. Will need to monitor carefully for excess sedation and side effects. If not working or not tolerated well he still can use an occasional Tylenol as long as it's very infrequent. If he takes one or 2 Tylenol every 2 weeks but is not unreasonable.

## 2016-03-25 LAB — URINALYSIS, ROUTINE W REFLEX MICROSCOPIC

## 2016-03-25 LAB — BRAIN NATRIURETIC PEPTIDE: BRAIN NATRIURETIC PEPTIDE: 138.3 pg/mL — AB (ref <100)

## 2016-03-26 MED ORDER — FUROSEMIDE 20 MG PO TABS: 20.0000 mg | ORAL_TABLET | Freq: Every day | ORAL | Status: DC | PRN

## 2016-03-26 NOTE — Addendum Note (Signed)
Addended by: [name] D on: 03/26/2016 05:52 PM   Modules accepted: Orders

## 2016-03-30 ENCOUNTER — Encounter: Payer: Self-pay | Admitting: Family Medicine

## 2016-04-12 ENCOUNTER — Ambulatory Visit (HOSPITAL_BASED_OUTPATIENT_CLINIC_OR_DEPARTMENT_OTHER)
Admission: RE | Admit: 2016-04-12 | Discharge: 2016-04-12 | Disposition: A | Discharge status: Home / Self Care | Payer: 59 | Source: Ambulatory Visit | Attending: Family Medicine | Admitting: Family Medicine

## 2016-04-12 DIAGNOSIS — R799 Abnormal finding of blood chemistry, unspecified: Secondary | ICD-10-CM | POA: Insufficient documentation

## 2016-04-12 DIAGNOSIS — I119 Hypertensive heart disease without heart failure: Secondary | ICD-10-CM | POA: Diagnosis not present

## 2016-04-12 DIAGNOSIS — M7989 Other specified soft tissue disorders: Secondary | ICD-10-CM | POA: Insufficient documentation

## 2016-04-12 DIAGNOSIS — I071 Rheumatic tricuspid insufficiency: Secondary | ICD-10-CM | POA: Insufficient documentation

## 2016-04-12 DIAGNOSIS — Z720 Tobacco use: Secondary | ICD-10-CM | POA: Diagnosis not present

## 2016-04-12 DIAGNOSIS — I340 Nonrheumatic mitral (valve) insufficiency: Secondary | ICD-10-CM | POA: Diagnosis not present

## 2016-04-12 DIAGNOSIS — R7989 Other specified abnormal findings of blood chemistry: Secondary | ICD-10-CM

## 2016-04-12 LAB — ECHOCARDIOGRAM COMPLETE
EERAT: 5.6
EWDT: 148 ms
FS: 18 % — AB (ref 28–44)
IVS/LV PW RATIO, ED: 0.89
LA diam end sys: 45 mm
LA diam index: 2.36 cm/m2
LA vol A4C: 78.4 ml
LA vol index: 37.7 mL/m2
LASIZE: 45 mm
LAVOL: 72 mL
LDCA: 3.8 cm2
LV PW d: 9.98 mm — AB (ref 0.6–1.1)
LV TDI E'LATERAL: 13.6
LV TDI E'MEDIAL: 10.3
LV e' LATERAL: 13.6 cm/s
LVEEAVG: 5.6
LVEEMED: 5.6
LVOTD: 22 mm
MV Dec: 148
MV Peak grad: 2 mmHg
MV pk A vel: 45.1 m/s
MVPKEVEL: 76.2 m/s
RV LATERAL S' VELOCITY: 11.7 cm/s
TAPSE: 13.9 mm

## 2016-04-12 NOTE — Progress Notes (Signed)
  Echocardiogram 2D Echocardiogram has been performed.  Derek Lopez 04/12/2016, 9:07 AM

## 2016-04-13 ENCOUNTER — Other Ambulatory Visit: Payer: Self-pay | Admitting: *Deleted

## 2016-04-13 ENCOUNTER — Encounter: Payer: Self-pay | Admitting: Family Medicine

## 2016-04-13 DIAGNOSIS — R9430 Abnormal result of cardiovascular function study, unspecified: Secondary | ICD-10-CM | POA: Insufficient documentation

## 2016-04-13 MED ORDER — FUROSEMIDE 20 MG PO TABS: 20.0000 mg | ORAL_TABLET | Freq: Every day | ORAL | Status: DC | PRN

## 2016-04-17 ENCOUNTER — Other Ambulatory Visit: Payer: Self-pay | Admitting: *Deleted

## 2016-04-17 DIAGNOSIS — R9430 Abnormal result of cardiovascular function study, unspecified: Secondary | ICD-10-CM

## 2016-04-24 ENCOUNTER — Encounter: Payer: Self-pay | Admitting: Family Medicine

## 2016-04-24 ENCOUNTER — Ambulatory Visit (INDEPENDENT_AMBULATORY_CARE_PROVIDER_SITE_OTHER): Payer: 59 | Admitting: Family Medicine

## 2016-04-24 VITALS — BP 116/76 | HR 83 | Wt 160.0 lb

## 2016-04-24 DIAGNOSIS — K7030 Alcoholic cirrhosis of liver without ascites: Secondary | ICD-10-CM | POA: Diagnosis not present

## 2016-04-24 DIAGNOSIS — M7989 Other specified soft tissue disorders: Secondary | ICD-10-CM | POA: Diagnosis not present

## 2016-04-24 DIAGNOSIS — Z79899 Other long term (current) drug therapy: Secondary | ICD-10-CM | POA: Diagnosis not present

## 2016-04-24 DIAGNOSIS — R931 Abnormal findings on diagnostic imaging of heart and coronary circulation: Secondary | ICD-10-CM | POA: Diagnosis not present

## 2016-04-24 LAB — BASIC METABOLIC PANEL WITH GFR
BUN: 4 mg/dL — ABNORMAL LOW (ref 7–25)
CALCIUM: 9.2 mg/dL (ref 8.6–10.3)
CO2: 24 mmol/L (ref 20–31)
Chloride: 97 mmol/L — ABNORMAL LOW (ref 98–110)
Creat: 0.58 mg/dL — ABNORMAL LOW (ref 0.70–1.33)
Glucose, Bld: 93 mg/dL (ref 65–99)
Potassium: 3.9 mmol/L (ref 3.5–5.3)
SODIUM: 134 mmol/L — AB (ref 135–146)

## 2016-04-24 NOTE — Progress Notes (Addendum)
Subjective:    CC: foot swelling  HPI: F/U bilateral foot swelling. Last saw him about 4 weeks ago. We did decide to go ahead and decrease his amlodipine down to 2.5 , to see if could be contributing to his swelling. He did have an echocardiogram done. He had diffuse hypokinesis of the heart. The left atrium was severely dilated. Ejection fraction was 45-50%. He says overall his swelling seems to come and go. Some days is better than others but typically gets worse as the day progresses. He has been taking the furosemide daily without any problems or side effects. No muscle cramping. Is not currently on potassium supplement.  Cirrhosis of the liver-  colonoscopy performed. He is due for repeat in 2 years.  Past medical history, Surgical history, Family history not pertinant except as noted below, Social history, Allergies, and medications have been entered into the medical record, reviewed, and corrections made.   Review of Systems: No fevers, chills, night sweats, weight loss, chest pain, or shortness of breath.   Objective:    General: Well Developed, well nourished, and in no acute distress.  Neuro: Alert and oriented x3, extra-ocular muscles intact, sensation grossly intact.  HEENT: Normocephalic, atraumatic  Skin: Warm and dry, no rashes. Cardiac: Regular rate and rhythm, no murmurs rubs or gallops, no lower extremity edema. Some slight weehze at the base at the bottom.  Respiratory: Clear to auscultation bilaterally. Not using accessory muscles, speaking in full sentences. EXt: Trace edema on the distal ends of both feet. No ankle swelling today.   Impression and Recommendations:   Lower extremity edema-we did reduce his amlodipine but I'm not sure that it really made much of a difference. Today's blood pressure actually looks great cerumen a continue with 2.5 mg for now. We'll continue with furosemide as well but will need to check potassium levels to make sure that we don't need to  start a daily supplement. Overall his feet and ankles look much better today compared to when I last saw him.  Abnormal echocardiogram-referral placed to cardiology last week. Will call and check on the referral status.  Cirrhosis of the liver-now following with GI. When I see him next will consider starting the Hep A/B vaccination series.

## 2016-05-16 ENCOUNTER — Other Ambulatory Visit: Payer: Self-pay | Admitting: Family Medicine

## 2016-06-14 ENCOUNTER — Ambulatory Visit (INDEPENDENT_AMBULATORY_CARE_PROVIDER_SITE_OTHER): Payer: 59 | Admitting: Physician Assistant

## 2016-06-14 ENCOUNTER — Encounter: Payer: Self-pay | Admitting: Physician Assistant

## 2016-06-14 VITALS — BP 137/76 | HR 66 | Ht 71.0 in | Wt 164.0 lb

## 2016-06-14 DIAGNOSIS — L0390 Cellulitis, unspecified: Secondary | ICD-10-CM | POA: Diagnosis not present

## 2016-06-14 DIAGNOSIS — L0291 Cutaneous abscess, unspecified: Secondary | ICD-10-CM | POA: Diagnosis not present

## 2016-06-14 MED ORDER — DOXYCYCLINE HYCLATE 100 MG PO TABS: 100.0000 mg | ORAL_TABLET | Freq: Two times a day (BID) | ORAL | 0 refills | Status: DC

## 2016-06-14 NOTE — Patient Instructions (Signed)
Incision and Drainage °Incision and drainage is a procedure in which a sac-like structure (cystic structure) is opened and drained. The area to be drained usually contains material such as pus, fluid, or blood.  °LET YOUR CAREGIVER KNOW ABOUT:  °· Allergies to medicine. °· Medicines taken, including vitamins, herbs, eyedrops, over-the-counter medicines, and creams. °· Use of steroids (by mouth or creams). °· Previous problems with anesthetics or numbing medicines. °· History of bleeding problems or blood clots. °· Previous surgery. °· Other health problems, including diabetes and kidney problems. °· Possibility of pregnancy, if this applies. °RISKS AND COMPLICATIONS °· Pain. °· Bleeding. °· Scarring. °· Infection. °BEFORE THE PROCEDURE  °You may need to have an ultrasound or other imaging tests to see how large or deep your cystic structure is. Blood tests may also be used to determine if you have an infection or how severe the infection is. You may need to have a tetanus shot. °PROCEDURE  °The affected area is cleaned with a cleaning fluid. The cyst area will then be numbed with a medicine (local anesthetic). A small incision will be made in the cystic structure. A syringe or catheter may be used to drain the contents of the cystic structure, or the contents may be squeezed out. The area will then be flushed with a cleansing solution. After cleansing the area, it is often gently packed with a gauze or another wound dressing. Once it is packed, it will be covered with gauze and tape or some other type of wound dressing.  °AFTER THE PROCEDURE  °· Often, you will be allowed to go home right after the procedure. °· You may be given antibiotic medicine to prevent or heal an infection. °· If the area was packed with gauze or some other wound dressing, you will likely need to come back in 1 to 2 days to get it removed. °· The area should heal in about 14 days. °  °This information is not intended to replace advice given  to you by your health care provider. Make sure you discuss any questions you have with your health care provider. °  °Document Released: 04/04/2001 Document Revised: 04/09/2012 Document Reviewed: 12/04/2011 °Elsevier Interactive Patient Education ©2016 [employer] ° °Abscess °An abscess is an infected area that contains a collection of pus and debris. It can occur in almost any part of the body. An abscess is also known as a furuncle or boil. °CAUSES  °An abscess occurs when tissue gets infected. This can occur from blockage of oil or sweat glands, infection of hair follicles, or a minor injury to the skin. As the body tries to fight the infection, pus collects in the area and creates pressure under the skin. This pressure causes pain. People with weakened immune systems have difficulty fighting infections and get certain abscesses more often.  °SYMPTOMS °Usually an abscess develops on the skin and becomes a painful mass that is red, warm, and tender. If the abscess forms under the skin, you may feel a moveable soft area under the skin. Some abscesses break open (rupture) on their own, but most will continue to get worse without care. The infection can spread deeper into the body and eventually into the bloodstream, causing you to feel ill.  °DIAGNOSIS  °Your caregiver will take your medical history and perform a physical exam. A sample of fluid may also be taken from the abscess to determine what is causing your infection. °TREATMENT  °Your caregiver may prescribe antibiotic medicines to fight the infection.   However, taking antibiotics alone usually does not cure an abscess. Your caregiver may need to make a small cut (incision) in the abscess to drain the pus. In some cases, gauze is packed into the abscess to reduce pain and to continue draining the area. °HOME CARE INSTRUCTIONS  °· Only take over-the-counter or prescription medicines for pain, discomfort, or fever as directed by your caregiver. °· If you were  prescribed antibiotics, take them as directed. Finish them even if you start to feel better. °· If gauze is used, follow your caregiver's directions for changing the gauze. °· To avoid spreading the infection: °¨ Keep your draining abscess covered with a bandage. °¨ Wash your hands well. °¨ Do not share personal care items, towels, or whirlpools with others. °¨ Avoid skin contact with others. °· Keep your skin and clothes clean around the abscess. °· Keep all follow-up appointments as directed by your caregiver. °SEEK MEDICAL CARE IF:  °· You have increased pain, swelling, redness, fluid drainage, or bleeding. °· You have muscle aches, chills, or a general ill feeling. °· You have a fever. °MAKE SURE YOU:  °· Understand these instructions. °· Will watch your condition. °· Will get help right away if you are not doing well or get worse. °  °This information is not intended to replace advice given to you by your health care provider. Make sure you discuss any questions you have with your health care provider. °  °Document Released: 07/19/2005 Document Revised: 04/09/2012 Document Reviewed: 12/22/2011 °Elsevier Interactive Patient Education ©2016 [employer] ° °

## 2016-06-14 NOTE — Progress Notes (Signed)
   Subjective:    Patient ID: Salam Verhagen, male    DOB: 03-Jan-1961, 55 y.o.   MRN: [id number]  HPI  Patient is a 55 year old male who presenwith a painfulMass on his left buttock. He has noticed this for 3-4 weeks. He has not done anything to make better. It does not seem to be going away. It hurts when he sits on his left side. He denies any fever, chills, nausea or vomiting.    Review of Systems    see HPI.  Objective:   Physical Exam  Constitutional: He appears well-developed and well-nourished.  Pulmonary/Chest: Effort normal and breath sounds normal.  Skin:  4cm by 3cm indurated mass on left buttock with surrounding erythema.           Assessment & Plan:  Abscess and cellulitis- Incision and Drainage Procedure Note  Pre-operative Diagnosis: abscess left buttock  Post-operative Diagnosis: same  Indications: infection/pain  Anesthesia: 1% plain lidocaine  Procedure Details  The procedure, risks and complications have been discussed in detail (including, but not limited to airway compromise, infection, bleeding) with the patient, and the patient has signed consent to the procedure.  The skin was sterilely prepped and draped over the affected area in the usual fashion. After adequate local anesthesia, I&D with a #11 blade was performed on the left buttock. Purulent drainage: present The patient was observed until stable.  EBL: scant  Condition: Tolerated procedure well   Complications: none.  Packed with sterile packing and told to come back in 2 days for recheck.  Doxycycline for 10 days given.  HO for symptomatic care.  Ibuprofen/tylenol for pain.

## 2016-06-15 ENCOUNTER — Encounter: Payer: Self-pay | Admitting: Cardiology

## 2016-06-16 ENCOUNTER — Encounter: Payer: Self-pay | Admitting: Physician Assistant

## 2016-06-16 ENCOUNTER — Ambulatory Visit (INDEPENDENT_AMBULATORY_CARE_PROVIDER_SITE_OTHER): Payer: 59 | Admitting: Physician Assistant

## 2016-06-16 VITALS — BP 124/77 | HR 70 | Ht 71.0 in | Wt 165.0 lb

## 2016-06-16 DIAGNOSIS — L0291 Cutaneous abscess, unspecified: Secondary | ICD-10-CM

## 2016-06-16 DIAGNOSIS — L0390 Cellulitis, unspecified: Secondary | ICD-10-CM

## 2016-06-16 NOTE — Progress Notes (Signed)
   Subjective:    Patient ID: Artee Biddick, male    DOB: 18-Feb-1961, 55 y.o.   MRN: [id number]  HPI  Patient is a 55 year old male who presents to the clinic to follow-up from an I and D on an abscess that was done 2 days ago. He is currently on doxycycline. He has no concerns or complaints.  Review of Systems    see history of present illness Objective:   Physical Exam  Skin:             Assessment & Plan:  Abscess and cellulitis- discuss with patient how he needs to replace bandage daily after taking a shower and patting dry. If any of the packing falls out that is okay. Continue doxycycline. Reassured patient that there was no signs of infection today. Follow-up on Monday or Tuesday of next week to assess healing.

## 2016-06-16 NOTE — Patient Instructions (Signed)
Take shower everyday and change bandage.

## 2016-06-20 ENCOUNTER — Encounter: Payer: Self-pay | Admitting: Physician Assistant

## 2016-06-20 ENCOUNTER — Ambulatory Visit (INDEPENDENT_AMBULATORY_CARE_PROVIDER_SITE_OTHER): Payer: 59 | Admitting: Physician Assistant

## 2016-06-20 VITALS — BP 137/97 | HR 64 | Wt 164.0 lb

## 2016-06-20 DIAGNOSIS — L0390 Cellulitis, unspecified: Secondary | ICD-10-CM

## 2016-06-20 DIAGNOSIS — L0291 Cutaneous abscess, unspecified: Secondary | ICD-10-CM

## 2016-06-20 NOTE — Progress Notes (Signed)
   Subjective:    Patient ID: Derek Lopez, male    DOB: [birth year], 55 y.o.   MRN: [id number]  HPI Patient is a 55 year old male who presents to the clinic to follow-up on abscess incision and drainage on 06/14/16.follow up 06/16/16 with heavy purulent discharge. Wound was re-packed. He is doing much better. He has no pain and no complaints. We did repack him at last visit he comes for to get the packing removed. He denies any fever, chills, nausea or vomiting. He has no concerns or complaints. He does admit that the wound still has some purulent drainage.   Review of Systems See HPI.     Objective:   Physical Exam  Constitutional: He appears well-developed and well-nourished.  Skin:  1inch incision on right buttocks. Scant purulent discharge. Packing still present.           Assessment & Plan:  Cellulitis and abscess- looks good today and healing well. Packing removed with tweezers. Discussed to keep covered while continues to heal. Finish doxycycline. Wash with soap and water. Pat dry. Follow up as needed.

## 2016-06-26 NOTE — Progress Notes (Signed)
HPI: 55 yo male for evaluation of cardiomyopathy and lower ext edema. Echo 6/17 showed EF 45-50, severe LAE, mild TR. Labs 6/17 showed BNP 138, Cr 0.6, TSH normal. Approximately 3 months ago the patient had pedal edema. His amlodipine was reduced and he was given a diuretic short-term with improvement. He otherwise denies dyspnea on exertion, orthopnea, PND, palpitations, chest pain, syncope or bleeding.  Current Outpatient Prescriptions  Medication Sig Dispense Refill  . albuterol (PROVENTIL HFA;VENTOLIN HFA) 108 (90 BASE) MCG/ACT inhaler Inhale 2 puffs into the lungs every 6 (six) hours as needed for wheezing or shortness of breath. 1 Inhaler 1  . amLODipine (NORVASC) 2.5 MG tablet Take 1 tablet (2.5 mg total) by mouth daily. 90 tablet 1  . b complex vitamins tablet Take 1 tablet by mouth daily.    . fluticasone furoate-vilanterol (BREO ELLIPTA) 100-25 MCG/INH AEPB Inhale 1 puff into the lungs daily. 60 each 6  . metoprolol succinate (TOPROL-XL) 200 MG 24 hr tablet Take 1 tablet (200 mg total) by mouth at bedtime. Take with or immediately following a meal. 90 tablet 1  . traMADol (ULTRAM) 50 MG tablet Take 1 tablet (50 mg total) by mouth every 8 (eight) hours as needed. 30 tablet 0   No current facility-administered medications for this visit.    Facility-Administered Medications Ordered in Other Visits  Medication Dose Route Frequency Provider Last Rate Last Dose  . iohexol (OMNIPAQUE) 350 MG/ML injection 100 mL  100 mL Intravenous Once PRN Hali Marry, MD         Past Medical History:  Diagnosis Date  . Cirrhosis ([place])   . Hypertension     Past Surgical History:  Procedure Laterality Date  . APPENDECTOMY    . TONSILLECTOMY      Social History   Social History  . Marital status: Married    Spouse name: N/A  . Number of children: 2  . Years of education: N/A   Occupational History  . Retired    Social History Main Topics  . Smoking status: Current Every  Day Smoker    Packs/day: 1.00    Types: Cigarettes  . Smokeless tobacco: Never Used  . Alcohol use 29.4 oz/week    49 Cans of beer per week     Comment: 12 pack per day  . Drug use: No  . Sexual activity: Yes    Partners: Female   Other Topics Concern  . Not on file   Social History Narrative   Says walk daily.  .  Drink coffe in the AM, 2 cups.      Family History  Problem Relation Age of Onset  . Cancer Mother     ROS:  Chronic hip and knee pain but no fevers or chills, productive cough, hemoptysis, dysphasia, odynophagia, melena, hematochezia, dysuria, hematuria, rash, seizure activity, orthopnea, PND, pedal edema, claudication. Remaining systems are negative.  Physical Exam: Well-developed well-nourished in no acute distress.  Skin is warm and dry.  HEENT is normal.  Neck is supple.  Chest is clear to auscultation with normal expansion.  Cardiovascular exam is regular rate and rhythm.  Abdominal exam nontender or distended. No masses palpated. Extremities show no edema. neuro grossly intact  ECG Atrial flutter at a rate of 62. Right bundle branch block.  A/P  1 Atrial flutter-newly diagnosed typical flutter. Duration unknown. Embolic risk factors include hypertension. CHADSvasc 1. Will begin apixaban 5 mg twice a day. Note LV function mildly  reduced. TSH normal. Continue metoprolol for rate control. I will refer to electrophysiology for atrial flutter ablation to avoid long-term anticoagulation. He does have a history of cirrhosis but has not had varices previously or GI bleeding. However he does have a history of alcohol abuse. I would like to avoid anticoagulation long-term.  2 alcohol abuse-patient counseled on decreasing.  3 tobacco abuse-patient counseled on discontinuing.  4 hypertension-blood pressure controlled. Continue present medications.  5 cirrhosis-management per primary care.  Kirk Ruths, MD

## 2016-06-28 ENCOUNTER — Encounter: Payer: Self-pay | Admitting: Cardiology

## 2016-06-28 ENCOUNTER — Ambulatory Visit (INDEPENDENT_AMBULATORY_CARE_PROVIDER_SITE_OTHER): Payer: 59 | Admitting: Cardiology

## 2016-06-28 VITALS — BP 122/68 | HR 62 | Ht 71.0 in | Wt 168.8 lb

## 2016-06-28 DIAGNOSIS — Z79899 Other long term (current) drug therapy: Secondary | ICD-10-CM | POA: Diagnosis not present

## 2016-06-28 DIAGNOSIS — I4892 Unspecified atrial flutter: Secondary | ICD-10-CM | POA: Diagnosis not present

## 2016-06-28 MED ORDER — APIXABAN 5 MG PO TABS: 5.0000 mg | ORAL_TABLET | Freq: Two times a day (BID) | ORAL | 6 refills | Status: DC

## 2016-06-28 NOTE — Patient Instructions (Signed)
Your physician has recommended you make the following change in your medication:  START ELIQUIS  5 MG  [place]   Your physician recommends that you return for lab work in: [place] recommends that you schedule a follow-up appointment in:   [place]

## 2016-07-13 ENCOUNTER — Ambulatory Visit: Payer: 59 | Admitting: Family Medicine

## 2016-07-15 ENCOUNTER — Other Ambulatory Visit: Payer: Self-pay | Admitting: Family Medicine

## 2016-07-20 ENCOUNTER — Ambulatory Visit (INDEPENDENT_AMBULATORY_CARE_PROVIDER_SITE_OTHER): Payer: 59 | Admitting: Family Medicine

## 2016-07-20 ENCOUNTER — Encounter: Payer: Self-pay | Admitting: Family Medicine

## 2016-07-20 VITALS — BP 124/86 | HR 63 | Wt 166.0 lb

## 2016-07-20 DIAGNOSIS — J449 Chronic obstructive pulmonary disease, unspecified: Secondary | ICD-10-CM

## 2016-07-20 DIAGNOSIS — M7989 Other specified soft tissue disorders: Secondary | ICD-10-CM

## 2016-07-20 DIAGNOSIS — Z23 Encounter for immunization: Secondary | ICD-10-CM | POA: Diagnosis not present

## 2016-07-20 DIAGNOSIS — I4892 Unspecified atrial flutter: Secondary | ICD-10-CM | POA: Insufficient documentation

## 2016-07-20 DIAGNOSIS — I483 Typical atrial flutter: Secondary | ICD-10-CM

## 2016-07-20 DIAGNOSIS — M79672 Pain in left foot: Secondary | ICD-10-CM

## 2016-07-20 DIAGNOSIS — K7030 Alcoholic cirrhosis of liver without ascites: Secondary | ICD-10-CM

## 2016-07-20 DIAGNOSIS — J45909 Unspecified asthma, uncomplicated: Secondary | ICD-10-CM

## 2016-07-20 MED ORDER — ALBUTEROL SULFATE HFA 108 (90 BASE) MCG/ACT IN AERS: 2.0000 | INHALATION_SPRAY | Freq: Four times a day (QID) | RESPIRATORY_TRACT | 4 refills | Status: DC | PRN

## 2016-07-20 NOTE — Progress Notes (Signed)
Subjective:    CC:     HPI:  55 year old male with cirrhosis comes in today to follow-up on a couple of concerns. Lower extremity edema is actually been improving. He was also recently diagnosed with atrial flutter. He has seen the cardiologist and actually has an appointment next week to speak with the EP physician about possible ablation.He is here to start his hepatitis A B series as well as get a flu shot updated.  COPD-has been using his. Dailybeen doing well. He does need a refill on his albuterol.Denies any recent flares or exacerbations.  Follow-up lower extremity edema-it does seem to have improved. They're today he's also complaining of some pain on the top of his left foot in particular. Though he says both feet will tingle and feel hot at times. He says sometimes it actually feels like he's walking in water. He thinks this could be contributing to his balance issues.  Past medical history, Surgical history, Family history not pertinant except as noted below, Social history, Allergies, and medications have been entered into the medical record, reviewed, and corrections made.   Review of Systems: No fevers, chills, night sweats, weight loss, chest pain, or shortness of breath.   Objective:    General: Well Developed, well nourished, and in no acute distress.  Neuro: Alert and oriented x3, extra-ocular muscles intact, sensation grossly intact.  HEENT: Normocephalic, atraumatic  Skin: Warm and dry, no rashes. Cardiac: Regular rate and rhythm, no murmurs rubs or gallops, no lower extremity edema.  Respiratory: Clear to auscultation bilaterally. Not using accessory muscles, speaking in full sentences. MSK: Dorsal pedal pulses 2+ bilaterally. Some trace edema around the left ankle. No edema on the right foot. Ankles with normal range of motion. Nontender over the metatarsals on the left foot. But he is tender between the third and fourth metatarsals approximately.   Impression and  Recommendations:    Alcoholic cirrhosis-we'll start hepatitis A B series today. Next  Atrial flutter-follow-up cardiology next week to discuss ablation. He also has follow-up labs scheduled for next Friday.  COPD-continue with Breo. Stable. Refill albuterol today.  Bilateral foot pain-the worse on the left. Suspect a neuroma on the left foot. Recommend referral to podiatry for further evaluation and treatment. He may also have some component of peripheral neuropathy as well.  Given flu vaccine today.

## 2016-07-24 ENCOUNTER — Encounter: Payer: Self-pay | Admitting: Family Medicine

## 2016-07-26 ENCOUNTER — Encounter: Payer: Self-pay | Admitting: *Deleted

## 2016-07-26 ENCOUNTER — Ambulatory Visit (INDEPENDENT_AMBULATORY_CARE_PROVIDER_SITE_OTHER): Payer: 59 | Admitting: Cardiology

## 2016-07-26 ENCOUNTER — Encounter: Payer: Self-pay | Admitting: Cardiology

## 2016-07-26 VITALS — BP 126/90 | HR 62 | Ht 71.0 in | Wt 167.8 lb

## 2016-07-26 DIAGNOSIS — Z01812 Encounter for preprocedural laboratory examination: Secondary | ICD-10-CM

## 2016-07-26 DIAGNOSIS — I483 Typical atrial flutter: Secondary | ICD-10-CM | POA: Diagnosis not present

## 2016-07-26 MED ORDER — APIXABAN 5 MG PO TABS: 5.0000 mg | ORAL_TABLET | Freq: Two times a day (BID) | ORAL | 0 refills | Status: DC

## 2016-07-26 NOTE — Progress Notes (Signed)
Electrophysiology Office Note   Date:  07/26/2016   ID:  Derek Lopez, DOB Dec 24, 1960, MRN [id number]  PCP:  [name], MD  Cardiologist:  [name] Primary Electrophysiologist:  [name] [name], MD    Chief Complaint  Patient presents with  . Advice Only    dicuss Aflutter ablation     History of Present Illness: Derek Lopez is a 55 y.o. male who presents today for electrophysiology evaluation.   He has a history of cirrhosis, hypertension, and cardiomyopathy with an EF of 45-50%. He presents today with atrial flutter that appears typical. He was referred to cardiology for follow-up as he was found to have a low ejection fraction and there was found to be in atrial flutter. He has no complaints of chest pain, shortness of breath, PND, or orthopnea. He was put on Eliquis at the time of his diagnosis.   Today, he denies symptoms of palpitations, chest pain, shortness of breath, orthopnea, PND, lower extremity edema, claudication, dizziness, presyncope, syncope, bleeding, or neurologic sequela. The patient is tolerating medications without difficulties and is otherwise without complaint today.    Past Medical History:  Diagnosis Date  . Cirrhosis ([place])   . Hypertension    Past Surgical History:  Procedure Laterality Date  . APPENDECTOMY    . TONSILLECTOMY       Current Outpatient Prescriptions  Medication Sig Dispense Refill  . albuterol (PROVENTIL HFA;VENTOLIN HFA) 108 (90 Base) MCG/ACT inhaler Inhale 2 puffs into the lungs every 6 (six) hours as needed for wheezing or shortness of breath. 1 Inhaler 4  . amLODipine (NORVASC) 2.5 MG tablet Take 1 tablet (2.5 mg total) by mouth daily. 90 tablet 1  . apixaban (ELIQUIS) 5 MG TABS tablet Take 1 tablet (5 mg total) by mouth 2 (two) times daily. 60 tablet 6  . b complex vitamins tablet Take 1 tablet by mouth daily.    Marland Kitchen BREO ELLIPTA 100-25 MCG/INH AEPB INHALE ONE PUFF BY MOUTH ONCE DAILY 60 each 6  . metoprolol  succinate (TOPROL-XL) 200 MG 24 hr tablet Take 1 tablet (200 mg total) by mouth at bedtime. Take with or immediately following a meal. 90 tablet 1  . tamsulosin (FLOMAX) 0.4 MG CAPS capsule Take 0.4 mg by mouth daily.    . traMADol (ULTRAM) 50 MG tablet Take 1 tablet (50 mg total) by mouth every 8 (eight) hours as needed. 30 tablet 0   No current facility-administered medications for this visit.    Facility-Administered Medications Ordered in Other Visits  Medication Dose Route Frequency Provider Last Rate Last Dose  . iohexol (OMNIPAQUE) 350 MG/ML injection 100 mL  100 mL Intravenous Once PRN Hali Marry, MD        Allergies:   Penicillins   Social History:  The patient  reports that he has been smoking Cigarettes.  He has been smoking about 1.00 pack per day. He has never used smokeless tobacco. He reports that he drinks about 29.4 oz of alcohol per week . He reports that he does not use drugs.   Family History:  The patient's family history includes Cancer in his mother.    ROS:  Please see the history of present illness.   Otherwise, review of systems is positive for none.   All other systems are reviewed and negative.    PHYSICAL EXAM: VS:  BP 126/90   Pulse 62   Ht 5\' 11"  (1.803 m)   Wt 167 lb 12.8 oz (76.1 kg)  BMI 23.40 kg/m  , BMI Body mass index is 23.4 kg/m. GEN: Well nourished, well developed, in no acute distress  HEENT: normal  Neck: no JVD, carotid bruits, or masses Cardiac: RRR; no murmurs, rubs, or gallops,no edema  Respiratory:  clear to auscultation bilaterally, normal work of breathing GI: soft, nontender, nondistended, + BS MS: no deformity or atrophy  Skin: warm and dry Neuro:  Strength and sensation are intact Psych: euthymic mood, full affect  EKG:  EKG is ordered today. Personal review of the ekg ordered shows atrial flutter, RBBB  Recent Labs: 03/24/2016: ALT 19; Brain Natriuretic Peptide 138.3; TSH 2.11 04/24/2016: BUN 4; Creat 0.58;  Potassium 3.9; Sodium 134    Lipid Panel     Component Value Date/Time   CHOL 200 01/11/2016 1548   TRIG 65 01/11/2016 1548   HDL 91 01/11/2016 1548   CHOLHDL 2.2 01/11/2016 1548   VLDL 13 01/11/2016 1548   LDLCALC 96 01/11/2016 1548     Wt Readings from Last 3 Encounters:  07/26/16 167 lb 12.8 oz (76.1 kg)  07/20/16 166 lb (75.3 kg)  06/28/16 168 lb 12.8 oz (76.6 kg)      Other studies Reviewed: Additional studies/ records that were reviewed today include: TTE 04/12/16 Review of the above records today demonstrates:  - Left ventricle: The cavity size was normal. Systolic function was   mildly reduced. The estimated ejection fraction was in the range   of 45% to 50%. Diffuse hypokinesis. The study is not technically   sufficient to allow evaluation of LV diastolic function. - Aortic valve: Transvalvular velocity was within the normal range.   There was no stenosis. There was no regurgitation. - Mitral valve: Transvalvular velocity was within the normal range.   There was no evidence for stenosis. There was trivial   regurgitation. - Left atrium: The atrium was severely dilated. - Right ventricle: The cavity size was normal. Wall thickness was   normal. Systolic function was normal. - Atrial septum: No defect or patent foramen ovale was identified   by color flow Doppler. - Tricuspid valve: There was mild regurgitation. - Pulmonary arteries: Systolic pressure was within the normal   range. PA peak pressure: 29 mm Hg (S).   ASSESSMENT AND PLAN:  1.  Atrial flutter: Appears typical on EKG. Unknown duration of his atrial flutter. Has been on Eliquis for anticoagulation for the last month. I did discuss with him the option of ablation for his flutter. Risks and benefits were discussed. Risks include bleeding, tamponade, heart block, and stroke. Both him and his wife understand the risks and have agreed to the procedure. Postprocedure would prefer to stop his anticoagulation as  he is a heavy drinker and a fall risk.  This patients CHA2DS2-VASc Score and unadjusted Ischemic Stroke Rate (% per year) is equal to 2.2 % stroke rate/year from a score of 2  Above score calculated as 1 point each if present [CHF, HTN, DM, Vascular=MI/PAD/Aortic Plaque, Age if 65-74, or Male] Above score calculated as 2 points each if present [Age > 75, or Stroke/TIA/TE]  2. Hypertension: Well-controlled today  3. Substance abuse: Heavy alcohol abuse as well as tobacco abuse. Counseled on cessation.     Current medicines are reviewed at length with the patient today.   The patient does not have concerns regarding his medicines.  The following changes were made today:  none  Labs/ tests ordered today include:  Orders Placed This Encounter  Procedures  . EKG 12-Lead  Disposition:   FU with Puneet Masoner 1 months  Signed, Zigmund Linse [name], MD  07/26/2016 4:20 PM     [address] [place] [address] [phone] (office) [phone] (fax)

## 2016-07-26 NOTE — Patient Instructions (Addendum)
Medication Instructions:    Your physician recommends that you continue on your current medications as directed. Please refer to the Current Medication list given to you today.  --- If you need a refill on your cardiac medications before your next appointment, please call your pharmacy. ---  Labwork:  Your physician recommends that you return for pre procedure lab work between the weeks of 09/14/16-09/27/16  for: BMET & CBC w/ diff  Testing/Procedures: Your physician has recommended that you have an ablation. Catheter ablation is a medical procedure used to treat some cardiac arrhythmias (irregular heartbeats). During catheter ablation, a long, thin, flexible tube is put into a blood vessel in your groin (upper thigh), or neck. This tube is called an ablation catheter. It is then guided to your heart through the blood vessel. Radio frequency waves destroy small areas of heart tissue where abnormal heartbeats may cause an arrhythmia to start. Please see the instruction sheet given to you today.  Follow-Up:  Your physician recommends that you schedule a follow-up appointment in: 4 weeks, after your ablation on 09/28/2016, with Dr. Curt Bears in [facility].   Thank you for choosing [facility]!!   Trinidad Curet, RN [phone]   Any Other Special Instructions Will Be Listed Below (If Applicable).  Cardiac Ablation Cardiac ablation is a procedure to disable a small amount of heart tissue in very specific places. The heart has many electrical connections. Sometimes these connections are abnormal and can cause the heart to beat very fast or irregularly. By disabling some of the problem areas, heart rhythm can be improved or made normal. Ablation is done for people who:   Have Wolff-Parkinson-White syndrome.   Have other fast heart rhythms (tachycardia).   Have taken medicines for an abnormal heart rhythm (arrhythmia) that resulted in:   No success.   Side effects.   May have a  high-risk heartbeat that could result in death.  LET [facility] CARE PROVIDER KNOW ABOUT:   Any allergies you have or any previous reactions you have had to X-ray dye, food (such as seafood), medicine, or tape.   All medicines you are taking, including vitamins, herbs, eye drops, creams, and over-the-counter medicines.   Previous problems you or members of your family have had with the use of anesthetics.   Any blood disorders you have.   Previous surgeries or procedures (such as a kidney transplant) you have had.   Medical conditions you have (such as kidney failure).  RISKS AND COMPLICATIONS Generally, cardiac ablation is a safe procedure. However, problems can occur and include:   Increased risk of cancer. Depending on how long it takes to do the ablation, the dose of radiation can be high.  Bruising and bleeding where a thin, flexible tube (catheter) was inserted during the procedure.   Bleeding into the chest, especially into the sac that surrounds the heart (serious).  Need for a permanent pacemaker if the normal electrical system is damaged.   The procedure may not be fully effective, and this may not be recognized for months. Repeat ablation procedures are sometimes required. BEFORE THE PROCEDURE   Follow any instructions from your health care provider regarding eating and drinking before the procedure.   Take your medicines as directed at regular times with water, unless instructed otherwise by your health care provider. If you are taking diabetes medicine, including insulin, ask how you are to take it and if there are any special instructions you should follow. It is common to adjust  insulin dosing the day of the ablation.  PROCEDURE  An ablation is usually performed in a catheterization laboratory with the guidance of fluoroscopy. Fluoroscopy is a type of X-ray that helps your health care provider see images of your heart during the procedure.   An ablation  is a minimally invasive procedure. This means a small cut (incision) is made in either your neck or groin. Your health care provider will decide where to make the incision based on your medical history and physical exam.  An IV tube will be started before the procedure begins. You will be given an anesthetic or medicine to help you relax (sedative).  The skin on your neck or groin will be numbed. A needle will be inserted into a large vein in your neck or groin and catheters will be threaded to your heart.  A special dye that shows up on fluoroscopy pictures may be injected through the catheter. The dye helps your health care provider see the area of the heart that needs treatment.  The catheter has electrodes on the tip. When the area of heart tissue that is causing the arrhythmia is found, the catheter tip will send an electrical current to the area and "scar" the tissue. Three types of energy can be used to ablate the heart tissue:   Heat (radiofrequency energy).   Laser energy.   Extreme cold (cryoablation).   When the area of the heart has been ablated, the catheter will be taken out. Pressure will be held on the insertion site. This will help the insertion site clot and keep it from bleeding. A bandage will be placed on the insertion site.  AFTER THE PROCEDURE   After the procedure, you will be taken to a recovery area where your vital signs (blood pressure, heart rate, and breathing) will be monitored. The insertion site will also be monitored for bleeding.   You will need to lie still for 4-6 hours. This is to ensure you do not bleed from the catheter insertion site.    This information is not intended to replace advice given to you by your health care provider. Make sure you discuss any questions you have with your health care provider.   Document Released: 02/25/2009 Document Revised: 10/30/2014 Document Reviewed: 03/03/2013 Elsevier Interactive Patient Education [employer]

## 2016-08-09 ENCOUNTER — Ambulatory Visit (INDEPENDENT_AMBULATORY_CARE_PROVIDER_SITE_OTHER): Payer: 59 | Admitting: Podiatry

## 2016-08-09 ENCOUNTER — Encounter: Payer: Self-pay | Admitting: Podiatry

## 2016-08-09 VITALS — BP 154/78 | HR 50

## 2016-08-09 DIAGNOSIS — M216X2 Other acquired deformities of left foot: Secondary | ICD-10-CM

## 2016-08-09 DIAGNOSIS — M79673 Pain in unspecified foot: Secondary | ICD-10-CM

## 2016-08-09 DIAGNOSIS — M216X9 Other acquired deformities of unspecified foot: Secondary | ICD-10-CM | POA: Diagnosis not present

## 2016-08-09 DIAGNOSIS — L4050 Arthropathic psoriasis, unspecified: Secondary | ICD-10-CM

## 2016-08-09 DIAGNOSIS — M79606 Pain in leg, unspecified: Secondary | ICD-10-CM

## 2016-08-09 DIAGNOSIS — M216X1 Other acquired deformities of right foot: Secondary | ICD-10-CM

## 2016-08-09 NOTE — Progress Notes (Signed)
SUBJECTIVE: 55 y.o. year old male presents accompanied by wife/friend stating both feet hurt all over with shooting, tingling, aching sensations for the past few months. Sometimes feels like walking on water. Pain on both feet are through out the day.  Last Summer he had developed fluid issues in lower limbs and was put on fluid pills for 2 months. Since then he has developed this pain on both feet.  Not employed. Not into any regular exercise.   HPI:  Significant for low back and hip pain about 10 years, still drinks 12 pack beer a day.  Diagnosed with Psoriasis for over 2 years. Diagnosed with poor Hepatic function.   OBJECTIVE: DERMATOLOGIC EXAMINATION: Multiple red dry erythematous patches in different sizes in both lower limbs.    VASCULAR EXAMINATION OF LOWER LIMBS: All pedal pulses are faintly palpable on both DP and PT bilateral. Capillary Filling times within 3 seconds in all digits.  No edema or acute erythema other than dry skin eruptions noted. Temperature gradient from tibial crest to dorsum of foot is within normal bilateral.  NEUROLOGIC EXAMINATION OF THE LOWER LIMBS: Achilles DTR is present and within normal. Monofilament (Semmes-Weinstein 10-gm) sensory testing positive 6 out of 6, bilateral. Vibratory sensations(128Hz  turning fork) intact at medial and lateral forefoot bilateral.  Sharp and Dull discriminatory sensations at the plantar ball of hallux is intact bilateral.   MUSCULOSKELETAL EXAMINATION: Positive for hypermobile first ray with excess sagittal plane motion bilateral. Subtalar joint hyperpronation with weight bearing bilateral. Normal joint motion with pain at the end range motion forefoot and rearfoot bilateral.   ASSESSMENT: Faulty biomechanics both feet, may not be the main cause of foot pain since patient has minimum daily activity.  Hyperalgia both feet. R/O Tarsal tunnel syndrome secondary to hyperpronation and faulty biomechanics.  R/O Psoriatic  arthropathy, Lumbar radiculopathy, Electrolytes depletion exacerbated arthropathy.   PLAN: Reviewed clinical findings and available treatment options. Patient is to replace half of the beer with Gatorade to maintain normal electrolytes.  Start exercising with daily walking as tolerated. Discussed proper shoe gear and orthotics to address faulty biomechanics.  Return in one month for evaluation.

## 2016-08-09 NOTE — Patient Instructions (Signed)
Seen for painful feet. Noted of weak joints and mal alignment of joints in both feet. Possible psoriatic arthritis and loss of electrolytes with daily large amount of beer.  Reviewed findings and advised to supplement electrolytes and reduce daily beer consumption. Return in one month.

## 2016-08-10 ENCOUNTER — Encounter: Payer: Self-pay | Admitting: Cardiology

## 2016-08-18 ENCOUNTER — Ambulatory Visit (INDEPENDENT_AMBULATORY_CARE_PROVIDER_SITE_OTHER): Payer: 59 | Admitting: Physician Assistant

## 2016-08-18 VITALS — BP 146/88 | HR 80 | Temp 98.0°F

## 2016-08-18 DIAGNOSIS — Z23 Encounter for immunization: Secondary | ICD-10-CM

## 2016-08-18 NOTE — Progress Notes (Signed)
Patient came into clinic today for second Hep A/B immunization. Pt reports no negative side effects from first immunization. Pt tolerated injection in right deltoid well, no immediate complications. Advised to follow up in 5 months for final vaccine in series. Verbalized understanding, he will call office to schedule. No further questions/concerns.

## 2016-09-03 ENCOUNTER — Other Ambulatory Visit: Payer: Self-pay | Admitting: Family Medicine

## 2016-09-05 ENCOUNTER — Encounter: Payer: Self-pay | Admitting: Pediatrics

## 2016-09-05 ENCOUNTER — Encounter: Payer: Self-pay | Admitting: Cardiology

## 2016-09-05 ENCOUNTER — Telehealth: Payer: Self-pay | Admitting: Pediatrics

## 2016-09-05 DIAGNOSIS — I483 Typical atrial flutter: Secondary | ICD-10-CM

## 2016-09-05 NOTE — Telephone Encounter (Signed)
Pt's wife sent MyChart message this AM inquiring about ablation sch for 09/29/16.  She states procedure was changed from 12/7 in [facility] to 12/8 in [place] with Dr. Curt Bears.  Please see MyChart message.  Pre-procedure labs entered and sent to [facility] in [place] at pt's request.  I also confirmed procedure time and date.  This patient does not have pre-procedure instructions, nor does he have an appointment within 30 days of procedure.

## 2016-09-05 NOTE — Telephone Encounter (Signed)
Spoke with patient's wife and let her know the procedure has always been in [place] as we do not do them in [place].  She was only wanting the address for [facility] as she has never been there before.  I gave her [address] and she is aware to go to [facility]

## 2016-09-18 ENCOUNTER — Ambulatory Visit: Payer: 59 | Admitting: Podiatry

## 2016-09-21 ENCOUNTER — Encounter: Payer: Self-pay | Admitting: Cardiology

## 2016-09-27 ENCOUNTER — Ambulatory Visit: Payer: 59 | Admitting: Cardiology

## 2016-09-29 ENCOUNTER — Ambulatory Visit (HOSPITAL_COMMUNITY)
Admission: RE | Admit: 2016-09-29 | Discharge: 2016-09-30 | Disposition: A | Discharge status: Home / Self Care | Payer: 59 | Source: Ambulatory Visit | Attending: Cardiology | Admitting: Cardiology

## 2016-09-29 ENCOUNTER — Encounter (HOSPITAL_COMMUNITY)
Admission: RE | Disposition: A | Discharge status: Home / Self Care | Payer: Self-pay | Source: Ambulatory Visit | Attending: Cardiology

## 2016-09-29 ENCOUNTER — Encounter (HOSPITAL_COMMUNITY): Payer: Self-pay | Admitting: Certified Registered Nurse Anesthetist

## 2016-09-29 ENCOUNTER — Ambulatory Visit (HOSPITAL_COMMUNITY): Payer: 59 | Admitting: Certified Registered Nurse Anesthetist

## 2016-09-29 DIAGNOSIS — Z7901 Long term (current) use of anticoagulants: Secondary | ICD-10-CM | POA: Diagnosis not present

## 2016-09-29 DIAGNOSIS — K7460 Unspecified cirrhosis of liver: Secondary | ICD-10-CM | POA: Insufficient documentation

## 2016-09-29 DIAGNOSIS — I4510 Unspecified right bundle-branch block: Secondary | ICD-10-CM | POA: Insufficient documentation

## 2016-09-29 DIAGNOSIS — J45909 Unspecified asthma, uncomplicated: Secondary | ICD-10-CM | POA: Insufficient documentation

## 2016-09-29 DIAGNOSIS — I429 Cardiomyopathy, unspecified: Secondary | ICD-10-CM | POA: Diagnosis not present

## 2016-09-29 DIAGNOSIS — I4892 Unspecified atrial flutter: Secondary | ICD-10-CM

## 2016-09-29 DIAGNOSIS — I110 Hypertensive heart disease with heart failure: Secondary | ICD-10-CM | POA: Diagnosis not present

## 2016-09-29 DIAGNOSIS — F10230 Alcohol dependence with withdrawal, uncomplicated: Secondary | ICD-10-CM | POA: Insufficient documentation

## 2016-09-29 DIAGNOSIS — F17210 Nicotine dependence, cigarettes, uncomplicated: Secondary | ICD-10-CM | POA: Diagnosis not present

## 2016-09-29 DIAGNOSIS — Z880 Allergy status to penicillin: Secondary | ICD-10-CM | POA: Insufficient documentation

## 2016-09-29 DIAGNOSIS — Z8679 Personal history of other diseases of the circulatory system: Secondary | ICD-10-CM | POA: Diagnosis present

## 2016-09-29 HISTORY — PX: ELECTROPHYSIOLOGIC STUDY: SHX172A

## 2016-09-29 SURGERY — A-FLUTTER ABLATION
Anesthesia: Monitor Anesthesia Care

## 2016-09-29 MED ORDER — SODIUM CHLORIDE 0.9% FLUSH: 3.0000 mL | Freq: Two times a day (BID) | INTRAVENOUS | Status: DC

## 2016-09-29 MED ORDER — B COMPLEX PO TABS: 1.0000 | ORAL_TABLET | Freq: Every day | ORAL | Status: DC

## 2016-09-29 MED ORDER — THIAMINE HCL 100 MG/ML IJ SOLN
100.0000 mg | Freq: Every day | INTRAMUSCULAR | Status: DC
  Filled 2016-09-29: qty 1

## 2016-09-29 MED ORDER — HEPARIN (PORCINE) IN NACL 2-0.9 UNIT/ML-% IJ SOLN
INTRAMUSCULAR | Status: DC | PRN
  Administered 2016-09-29: 15:00:00

## 2016-09-29 MED ORDER — LORAZEPAM 0.5 MG PO TABS
1.0000 mg | ORAL_TABLET | Freq: Four times a day (QID) | ORAL | Status: DC | PRN
  Administered 2016-09-30: 07:00:00 1 mg via ORAL
  Filled 2016-09-29: qty 2

## 2016-09-29 MED ORDER — TAMSULOSIN HCL 0.4 MG PO CAPS
0.4000 mg | ORAL_CAPSULE | Freq: Every day | ORAL | Status: DC
  Administered 2016-09-29 – 2016-09-30 (×2): 0.4 mg via ORAL
  Filled 2016-09-29 (×2): qty 1

## 2016-09-29 MED ORDER — LORAZEPAM 2 MG/ML IJ SOLN
1.0000 mg | Freq: Four times a day (QID) | INTRAMUSCULAR | Status: DC | PRN
  Administered 2016-09-29 – 2016-09-30 (×2): 1 mg via INTRAVENOUS
  Filled 2016-09-29 (×2): qty 1

## 2016-09-29 MED ORDER — OFF THE BEAT BOOK
Freq: Once | Status: AC
  Administered 2016-09-29: 22:00:00
  Filled 2016-09-29: qty 1

## 2016-09-29 MED ORDER — MAGNESIUM OXIDE 400 (241.3 MG) MG PO TABS
400.0000 mg | ORAL_TABLET | Freq: Every day | ORAL | Status: DC
  Administered 2016-09-29: 400 mg via ORAL
  Filled 2016-09-29 (×2): qty 1

## 2016-09-29 MED ORDER — BUPIVACAINE HCL (PF) 0.25 % IJ SOLN
INTRAMUSCULAR | Status: AC
  Filled 2016-09-29: qty 60

## 2016-09-29 MED ORDER — FOLIC ACID 1 MG PO TABS
1.0000 mg | ORAL_TABLET | Freq: Every day | ORAL | Status: DC
  Filled 2016-09-29: qty 1

## 2016-09-29 MED ORDER — ALBUTEROL SULFATE (2.5 MG/3ML) 0.083% IN NEBU: 2.5000 mg | INHALATION_SOLUTION | Freq: Four times a day (QID) | RESPIRATORY_TRACT | Status: DC | PRN

## 2016-09-29 MED ORDER — HEPARIN (PORCINE) IN NACL 2-0.9 UNIT/ML-% IJ SOLN
INTRAMUSCULAR | Status: AC
  Filled 2016-09-29: qty 500

## 2016-09-29 MED ORDER — METOPROLOL SUCCINATE ER 50 MG PO TB24
50.0000 mg | ORAL_TABLET | Freq: Every day | ORAL | Status: DC
  Administered 2016-09-29: 50 mg via ORAL
  Filled 2016-09-29 (×2): qty 1

## 2016-09-29 MED ORDER — SODIUM CHLORIDE 0.9 % IV SOLN: 250.0000 mL | INTRAVENOUS | Status: DC | PRN

## 2016-09-29 MED ORDER — EPHEDRINE SULFATE 50 MG/ML IJ SOLN
INTRAMUSCULAR | Status: DC | PRN
Start: 2016-09-29 — End: 2016-09-29
  Administered 2016-09-29 (×2): 10 mg via INTRAVENOUS

## 2016-09-29 MED ORDER — SODIUM CHLORIDE 0.9% FLUSH: 3.0000 mL | INTRAVENOUS | Status: DC | PRN

## 2016-09-29 MED ORDER — LACTATED RINGERS IV SOLN
INTRAVENOUS | Status: DC | PRN
  Administered 2016-09-29: 14:00:00 via INTRAVENOUS

## 2016-09-29 MED ORDER — TRAMADOL HCL 50 MG PO TABS: 50.0000 mg | ORAL_TABLET | Freq: Three times a day (TID) | ORAL | Status: DC | PRN

## 2016-09-29 MED ORDER — ADULT MULTIVITAMIN W/MINERALS CH
1.0000 | ORAL_TABLET | Freq: Every day | ORAL | Status: DC
  Administered 2016-09-30: 09:00:00 1 via ORAL
  Filled 2016-09-29: qty 1

## 2016-09-29 MED ORDER — FENTANYL CITRATE (PF) 100 MCG/2ML IJ SOLN
INTRAMUSCULAR | Status: DC | PRN
  Administered 2016-09-29: 100 ug via INTRAVENOUS

## 2016-09-29 MED ORDER — APIXABAN 5 MG PO TABS
5.0000 mg | ORAL_TABLET | Freq: Two times a day (BID) | ORAL | Status: DC
  Administered 2016-09-29 – 2016-09-30 (×2): 5 mg via ORAL
  Filled 2016-09-29 (×2): qty 1

## 2016-09-29 MED ORDER — PROPOFOL 500 MG/50ML IV EMUL
INTRAVENOUS | Status: DC | PRN
  Administered 2016-09-29: 100 ug/kg/min via INTRAVENOUS

## 2016-09-29 MED ORDER — DEXMEDETOMIDINE HCL IN NACL 200 MCG/50ML IV SOLN: 0.2000 ug/kg/h | INTRAVENOUS | Status: DC

## 2016-09-29 MED ORDER — FLUTICASONE FUROATE-VILANTEROL 100-25 MCG/INH IN AEPB
1.0000 | INHALATION_SPRAY | Freq: Every day | RESPIRATORY_TRACT | Status: DC
  Filled 2016-09-29: qty 28

## 2016-09-29 MED ORDER — ACETAMINOPHEN 325 MG PO TABS: 650.0000 mg | ORAL_TABLET | ORAL | Status: DC | PRN

## 2016-09-29 MED ORDER — SPIRITUS FRUMENTI
1.0000 | Freq: Every day | ORAL | Status: DC
  Administered 2016-09-29 – 2016-09-30 (×2): 1 via ORAL
  Filled 2016-09-29 (×2): qty 1

## 2016-09-29 MED ORDER — ONDANSETRON HCL 4 MG/2ML IJ SOLN
4.0000 mg | Freq: Four times a day (QID) | INTRAMUSCULAR | Status: DC | PRN
  Administered 2016-09-29 – 2016-09-30 (×2): 4 mg via INTRAVENOUS
  Filled 2016-09-29 (×2): qty 2

## 2016-09-29 MED ORDER — BUPIVACAINE HCL (PF) 0.25 % IJ SOLN
INTRAMUSCULAR | Status: DC | PRN
  Administered 2016-09-29: 35 mL

## 2016-09-29 MED ORDER — NICOTINE 14 MG/24HR TD PT24
14.0000 mg | MEDICATED_PATCH | Freq: Every day | TRANSDERMAL | Status: DC
  Administered 2016-09-29: 18:00:00 14 mg via TRANSDERMAL
  Filled 2016-09-29: qty 1

## 2016-09-29 MED ORDER — MIDAZOLAM HCL 5 MG/5ML IJ SOLN
INTRAMUSCULAR | Status: DC | PRN
  Administered 2016-09-29: 2 mg via INTRAVENOUS

## 2016-09-29 MED ORDER — LIDOCAINE HCL (CARDIAC) 20 MG/ML IV SOLN
INTRAVENOUS | Status: DC | PRN
  Administered 2016-09-29: 40 mg via INTRAVENOUS

## 2016-09-29 MED ORDER — B COMPLEX-C PO TABS
1.0000 | ORAL_TABLET | Freq: Every day | ORAL | Status: DC
  Filled 2016-09-29: qty 1

## 2016-09-29 MED ORDER — VITAMIN B-1 100 MG PO TABS
100.0000 mg | ORAL_TABLET | Freq: Every day | ORAL | Status: DC
  Administered 2016-09-30: 100 mg via ORAL
  Filled 2016-09-29: qty 1

## 2016-09-29 MED ORDER — HEPARIN (PORCINE) IN NACL 2-0.9 UNIT/ML-% IJ SOLN
INTRAMUSCULAR | Status: AC
Start: 2016-09-29 — End: 2016-09-29
  Filled 2016-09-29: qty 500

## 2016-09-29 MED ORDER — AMLODIPINE BESYLATE 5 MG PO TABS
2.5000 mg | ORAL_TABLET | Freq: Every day | ORAL | Status: DC
  Filled 2016-09-29 (×2): qty 1

## 2016-09-29 SURGICAL SUPPLY — 11 items
BAG SNAP BAND KOVER 36X36 (MISCELLANEOUS) ×3 IMPLANT
BLANKET WARM UNDERBOD FULL ACC (MISCELLANEOUS) ×3 IMPLANT
CATH EZ STEER NAV 8MM D-F CUR (ABLATOR) ×2 IMPLANT
CATH JOSEPH QUAD ALLRED 6F REP (CATHETERS) ×2 IMPLANT
CATH WEBSTER BI DIR CS D-F CRV (CATHETERS) ×2 IMPLANT
PACK EP LATEX FREE (CUSTOM PROCEDURE TRAY) ×3
PACK EP LF (CUSTOM PROCEDURE TRAY) ×1 IMPLANT
PAD DEFIB LIFELINK (PAD) ×3 IMPLANT
SHEATH PINNACLE 6F 10CM (SHEATH) ×2 IMPLANT
SHEATH PINNACLE 7F 10CM (SHEATH) ×2 IMPLANT
SHEATH PINNACLE 8F 10CM (SHEATH) ×2 IMPLANT

## 2016-09-29 NOTE — Progress Notes (Signed)
Site area: Right groin a 6,7,8 french venous sheath was removed  Site Prior to Removal:  Level 0  Pressure Applied For 20 MINUTES    Bedrest Beginning at 1620p  Manual:   Yes.    Patient Status During Pull:  stable  Post Pull Groin Site:  Level 0  Post Pull Instructions Given:  Yes.    Post Pull Pulses Present:  Yes.    Dressing Applied:  Yes.    Comments:  VS remain stable during sheath pull

## 2016-09-29 NOTE — Discharge Instructions (Signed)
No driving for 1 week. No lifting over 5 lbs for 1 week. No vigorous or sexual activity for 1 week. You may return to work on 10/06/16. Keep procedure site clean & dry. If you notice increased pain, swelling, bleeding or pus, call/return!  You may shower, but no soaking baths/hot tubs/pools for 1 week.

## 2016-09-29 NOTE — Transfer of Care (Signed)
Immediate Anesthesia Transfer of Care Note  Patient: Derek Lopez  Procedure(s) Performed: Procedure(s): A-Flutter (N/A)  Patient Location: Cath Lab  Anesthesia Type:MAC  Level of Consciousness: awake, alert  and patient cooperative  Airway & Oxygen Therapy: Patient Spontanous Breathing  Post-op Assessment: Report given to RN and Post -op Vital signs reviewed and stable  Post vital signs: Reviewed and stable  Last Vitals:  Vitals:   09/29/16 0933  BP: 134/86  Pulse: 66  Resp: 18  Temp: 36.7 C    Last Pain:  Vitals:   09/29/16 0933  TempSrc: Oral         Complications: No apparent anesthesia complications

## 2016-09-29 NOTE — Anesthesia Preprocedure Evaluation (Addendum)
Anesthesia Evaluation  Patient identified by MRN, date of birth, ID band Patient awake    Reviewed: Allergy & Precautions, H&P , NPO status , Patient's Chart, lab work & pertinent test results, reviewed documented beta blocker date and time   Airway Mallampati: II   Neck ROM: Full    Dental no notable dental hx. (+) Edentulous Upper, Edentulous Lower, Dental Advisory Given   Pulmonary asthma , Current Smoker,    + rhonchi        Cardiovascular hypertension, Pt. on medications and Pt. on home beta blockers Normal cardiovascular exam+ dysrhythmias Atrial Fibrillation  Rhythm:Regular Rate:Normal     Neuro/Psych negative neurological ROS  negative psych ROS   GI/Hepatic negative GI ROS, (+) Cirrhosis     substance abuse  alcohol use,   Endo/Other  negative endocrine ROS  Renal/GU negative Renal ROS  negative genitourinary   Musculoskeletal   Abdominal   Peds  Hematology negative hematology ROS (+)   Anesthesia Other Findings   Reproductive/Obstetrics negative OB ROS                            Anesthesia Physical Anesthesia Plan  ASA: III  Anesthesia Plan: MAC   Post-op Pain Management:    Induction: Intravenous  Airway Management Planned: Simple Face Mask  Additional Equipment:   Intra-op Plan:   Post-operative Plan:   Informed Consent: I have reviewed the patients History and Physical, chart, labs and discussed the procedure including the risks, benefits and alternatives for the proposed anesthesia with the patient or authorized representative who has indicated his/her understanding and acceptance.   Dental advisory given  Plan Discussed with: CRNA  Anesthesia Plan Comments:        Anesthesia Quick Evaluation

## 2016-09-29 NOTE — H&P (Signed)
History of Present Illness: Derek Lopez is a 55 y.o. male who presents today for electrophysiology evaluation.   He has a history of cirrhosis, hypertension, and cardiomyopathy with an EF of 45-50%. He presents today with atrial flutter that appears typical. He has no complaints of chest pain, shortness of breath, PND, or orthopnea. He was put on Eliquis at the time of his diagnosis. Presenting today for ablation of atrial flutter.   Today, he denies symptoms of palpitations, chest pain, shortness of breath, orthopnea, PND, lower extremity edema, claudication, dizziness, presyncope, syncope, bleeding, or neurologic sequela. The patient is tolerating medications without difficulties and is otherwise without complaint today.        Past Medical History:  Diagnosis Date  . Cirrhosis ([place])   . Hypertension         Past Surgical History:  Procedure Laterality Date  . APPENDECTOMY    . TONSILLECTOMY             Current Outpatient Prescriptions  Medication Sig Dispense Refill  . albuterol (PROVENTIL HFA;VENTOLIN HFA) 108 (90 Base) MCG/ACT inhaler Inhale 2 puffs into the lungs every 6 (six) hours as needed for wheezing or shortness of breath. 1 Inhaler 4  . amLODipine (NORVASC) 2.5 MG tablet Take 1 tablet (2.5 mg total) by mouth daily. 90 tablet 1  . apixaban (ELIQUIS) 5 MG TABS tablet Take 1 tablet (5 mg total) by mouth 2 (two) times daily. 60 tablet 6  . b complex vitamins tablet Take 1 tablet by mouth daily.    Marland Kitchen BREO ELLIPTA 100-25 MCG/INH AEPB INHALE ONE PUFF BY MOUTH ONCE DAILY 60 each 6  . metoprolol succinate (TOPROL-XL) 200 MG 24 hr tablet Take 1 tablet (200 mg total) by mouth at bedtime. Take with or immediately following a meal. 90 tablet 1  . tamsulosin (FLOMAX) 0.4 MG CAPS capsule Take 0.4 mg by mouth daily.    . traMADol (ULTRAM) 50 MG tablet Take 1 tablet (50 mg total) by mouth every 8 (eight) hours as needed. 30 tablet 0   No current  facility-administered medications for this visit.             Facility-Administered Medications Ordered in Other Visits  Medication Dose Route Frequency Provider Last Rate Last Dose  . iohexol (OMNIPAQUE) 350 MG/ML injection 100 mL  100 mL Intravenous Once PRN Hali Marry, MD        Allergies:   Penicillins   Social History:  The patient  reports that he has been smoking Cigarettes.  He has been smoking about 1.00 pack per day. He has never used smokeless tobacco. He reports that he drinks about 29.4 oz of alcohol per week . He reports that he does not use drugs.   Family History:  The patient's family history includes Cancer in his mother.    ROS:  Please see the history of present illness.   Otherwise, review of systems is positive for none.   All other systems are reviewed and negative.    PHYSICAL EXAM: Vitals:   09/29/16 0933  BP: 134/86  Pulse: 66  Resp: 18  Temp: 98.1 F (36.7 C)    GEN: Well nourished, well developed, in no acute distress  HEENT: normal  Neck: no JVD, carotid bruits, or masses Cardiac: iRRR; no murmurs, rubs, or gallops,no edema  Respiratory:  clear to auscultation bilaterally, normal work of breathing GI: soft, nontender, nondistended, + BS MS: no deformity or atrophy  Skin: warm and dry Neuro:  Strength and sensation are intact Psych: euthymic mood, full affect  EKG:  EKG is ordered today. Personal review of the ekg ordered shows atrial flutter, RBBB  Recent Labs: 03/24/2016: ALT 19; Brain Natriuretic Peptide 138.3; TSH 2.11 04/24/2016: BUN 4; Creat 0.58; Potassium 3.9; Sodium 134    Lipid Panel  Labs(Brief)          Component Value Date/Time   CHOL 200 01/11/2016 1548   TRIG 65 01/11/2016 1548   HDL 91 01/11/2016 1548   CHOLHDL 2.2 01/11/2016 1548   VLDL 13 01/11/2016 1548   LDLCALC 96 01/11/2016 1548          Wt Readings from Last 3 Encounters:  07/26/16 167 lb 12.8 oz (76.1 kg)  07/20/16 166 lb  (75.3 kg)  06/28/16 168 lb 12.8 oz (76.6 kg)      Other studies Reviewed: Additional studies/ records that were reviewed today include: TTE 04/12/16 Review of the above records today demonstrates:  - Left ventricle: The cavity size was normal. Systolic function was mildly reduced. The estimated ejection fraction was in the range of 45% to 50%. Diffuse hypokinesis. The study is not technically sufficient to allow evaluation of LV diastolic function. - Aortic valve: Transvalvular velocity was within the normal range. There was no stenosis. There was no regurgitation. - Mitral valve: Transvalvular velocity was within the normal range. There was no evidence for stenosis. There was trivial regurgitation. - Left atrium: The atrium was severely dilated. - Right ventricle: The cavity size was normal. Wall thickness was normal. Systolic function was normal. - Atrial septum: No defect or patent foramen ovale was identified by color flow Doppler. - Tricuspid valve: There was mild regurgitation. - Pulmonary arteries: Systolic pressure was within the normal range. PA peak pressure: 29 mm Hg (S).   ASSESSMENT AND PLAN:  1.  Atrial flutter: Appears typical on EKG. Unknown duration of his atrial flutter. Has been on Eliquis for anticoagulation for the last month. Presenting today for ablation.  Risks and benefits discussed. Risks include but not limited to bleeding, tamponade, heart block, stroke.  He understands the risks and has agreed to ablation.  This patients CHA2DS2-VASc Score and unadjusted Ischemic Stroke Rate (% per year) is equal to 2.2 % stroke rate/year from a score of 2  Above score calculated as 1 point each if present [CHF, HTN, DM, Vascular=MI/PAD/Aortic Plaque, Age if 65-74, or Male] Above score calculated as 2 points each if present [Age > 75, or Stroke/TIA/TE]  2. Hypertension: Well-controlled today  3. Substance abuse: Heavy alcohol abuse as  well as tobacco abuse. Counseled on cessation.

## 2016-09-29 NOTE — Discharge Summary (Signed)
ELECTROPHYSIOLOGY PROCEDURE DISCHARGE SUMMARY    Patient ID: Derek Lopez,  MRN: [id number], DOB/AGE: 1961/07/17 55 y.o.  Admit date: 09/29/2016 Discharge date: 09/30/16  Primary Care Physician: [name], MD  Primary Cardiologist: Dr. [name] Electrophysiologist: Dr. Curt Bears  Primary Discharge Diagnosis:  1. Atrial flutter status post ablation this admission 2. Alcohol withdrawal  Secondary Discharge Diagnosis:  1. CM 2. HTN 3. Cirrhosis   Allergies  Allergen Reactions  . Penicillins Rash     Procedures This Admission: 1.  Electrophysiology study and radiofrequency catheter ablation on 09/29/16 by Dr Curt Bears.   CONCLUSIONS:   1. Isthmus-dependent right atrial flutter upon presentation.   2. Successful radiofrequency ablation of atrial flutter along the cavotricuspid isthmus with complete bidirectional isthmus block achieved.   3. No inducible arrhythmias following ablation.   4. No early apparent complications.  There were no inducible arrhythmias following ablation and no early apparent complications.   Brief HPI: Derek Lopez is a 55 y.o. male with a past medical history as outlined above. He has documented atrial flutter. He has been appropriately anticoagulated for >4 weeks.  Risks, benefits, and alternatives to ablation were reviewed with the patient who wished to proceed.   Hospital Course:  The patient was admitted and underwent EPS/RFCA with details as outlined above. He was monitored on telemetry overnight which demonstrated NSR with few missed beats. Groin was without complication. Of note, patient went into alcohol withdrawal during admission requiring CIWA protocol and was administered a beer for comfort. He was examined by Dr Rayann Heman who considered them stable for discharge to home.  Follow up has been arranged in 4 weeks.  Wound care and restrictions were reviewed with the patient prior to discharge.   Physical Exam: Vitals:   09/30/16  0300 09/30/16 0406 09/30/16 0646 09/30/16 0700  BP: (!) 144/74 (!) 145/85 (!) 161/76 (!) 164/81  Pulse: 71 86  73  Resp:  18  18  Temp:  98 F (36.7 C)  99 F (37.2 C)  TempSrc:  Oral  Oral  SpO2:  95%  96%  Weight:  175 lb 4.3 oz (79.5 kg)    Height:        GEN- The patient is well appearing, alert and oriented x 3 today.   HEENT: normocephalic, atraumatic; sclera clear, conjunctiva pink; hearing intact; oropharynx clear; neck supple, no JVP Lymph- no cervical lymphadenopathy Lungs- Clear to ausculation bilaterally, normal work of breathing.  No wheezes, rales, rhonchi Heart- Regular rate and rhythm, no murmurs, rubs or gallops, PMI not laterally displaced GI- soft, non-tender, non-distended, bowel sounds present, no hepatosplenomegaly Extremities- no clubbing, cyanosis, or edema; DP/PT/radial pulses 2+ bilaterally, groin without hematoma/bruit MS- no significant deformity or atrophy Skin- warm and dry, no rash or lesion Psych- euthymic mood, full affect Neuro- strength and sensation are intact   Labs:  No results found for: WBC, HGB, HCT, MCV, PLT No results for input(s): NA, K, CL, CO2, BUN, CREATININE, CALCIUM, PROT, BILITOT, ALKPHOS, ALT, AST, GLUCOSE in the last 168 hours.  Invalid input(s): LABALBU  Discharge Medications:    Medication List    TAKE these medications   albuterol 108 (90 Base) MCG/ACT inhaler Commonly known as:  PROVENTIL HFA;VENTOLIN HFA Inhale 2 puffs into the lungs every 6 (six) hours as needed for wheezing or shortness of breath.   amLODipine 2.5 MG tablet Commonly known as:  NORVASC Take 1 tablet (2.5 mg total) by mouth daily.   apixaban 5 MG Tabs tablet Commonly known  asArne Cleveland Take 1 tablet (5 mg total) by mouth 2 (two) times daily.   b complex vitamins tablet Take 1 tablet by mouth daily.   BREO ELLIPTA 100-25 MCG/INH Aepb Generic drug:  fluticasone furoate-vilanterol INHALE ONE PUFF BY MOUTH ONCE DAILY   MAGNESIUM PO Take 1  tablet by mouth daily.   metoprolol 200 MG 24 hr tablet Commonly known as:  TOPROL-XL TAKE ONE TABLET BY MOUTH AT BEDTIME TAKE  WITH  OR  IMMEDIATELY  FOLLOWING  A  MEAL What changed:  See the new instructions.   tamsulosin 0.4 MG Caps capsule Commonly known as:  FLOMAX Take 0.4 mg by mouth daily.   traMADol 50 MG tablet Commonly known as:  ULTRAM Take 1 tablet (50 mg total) by mouth every 8 (eight) hours as needed. What changed:  reasons to take this       Disposition:  Home  Follow-up Information    Will [name], MD Follow up on 11/08/2016.   Specialty:  Cardiology Why:  1:30PM  Contact information: [address] [place] [address] [phone]           Duration of Discharge Encounter: Greater than 30 minutes including physician time.  Signed, Angelena Form PA-C   09/30/2016 8:01 AM   I have seen, examined the patient, and reviewed the above assessment and plan.  On exam, well appearing.  RRR.   Changes to above are made where necessary.   Importance of compliance with anticoagulation discussed with the patient.  He will requiring aggressive lifestyle modification going forward.  DC to home Routine groin care and follow-up.  Co Sign: Thompson Grayer, MD 09/30/2016 9:29 AM

## 2016-09-29 NOTE — Anesthesia Postprocedure Evaluation (Signed)
Anesthesia Post Note  Patient: Derek Lopez  Procedure(s) Performed: Procedure(s) (LRB): A-Flutter (N/A)  Patient location during evaluation: Cath Lab Anesthesia Type: MAC Level of consciousness: awake and alert Pain management: pain level controlled Vital Signs Assessment: post-procedure vital signs reviewed and stable Respiratory status: spontaneous breathing, nonlabored ventilation, respiratory function stable and patient connected to nasal cannula oxygen Cardiovascular status: stable and blood pressure returned to baseline Anesthetic complications: no    Last Vitals:  Vitals:   09/29/16 1620 09/29/16 1625  BP: (!) 152/74   Pulse: 62 63  Resp: 14 11  Temp:  36.6 C    Last Pain:  Vitals:   09/29/16 0933  TempSrc: Oral                 [name]

## 2016-09-30 ENCOUNTER — Other Ambulatory Visit: Payer: Self-pay | Admitting: Family Medicine

## 2016-09-30 DIAGNOSIS — I483 Typical atrial flutter: Secondary | ICD-10-CM | POA: Diagnosis not present

## 2016-09-30 DIAGNOSIS — I110 Hypertensive heart disease with heart failure: Secondary | ICD-10-CM | POA: Diagnosis not present

## 2016-09-30 DIAGNOSIS — I429 Cardiomyopathy, unspecified: Secondary | ICD-10-CM | POA: Diagnosis not present

## 2016-09-30 DIAGNOSIS — K7460 Unspecified cirrhosis of liver: Secondary | ICD-10-CM | POA: Diagnosis not present

## 2016-09-30 DIAGNOSIS — I4892 Unspecified atrial flutter: Secondary | ICD-10-CM | POA: Diagnosis not present

## 2016-09-30 NOTE — Plan of Care (Signed)
Problem: Education: Goal: Understanding of disease, treatment, and recovery process will improve Outcome: Completed/Met Date Met: 09/30/16 All teaching done with patient and wife.

## 2016-10-02 ENCOUNTER — Encounter (HOSPITAL_COMMUNITY): Payer: Self-pay | Admitting: Cardiology

## 2016-10-28 ENCOUNTER — Other Ambulatory Visit: Payer: Self-pay | Admitting: Family Medicine

## 2016-11-01 IMAGING — CT CT ABDOMEN WO/W CM
2 of 9 series · 11 of 46 positions shown, 17 images · IV contrast (omnipaque)
Comparison: Abdominal ultrasound 10/02/2014.

CLINICAL DATA: Pancreatic lesion. History of hypertension, smoking
and appendectomy.

EXAM:
CT ABDOMEN WITHOUT AND WITH CONTRAST
TECHNIQUE: Multidetector CT imaging of the abdomen was performed following the
standard protocol before and following the bolus administration of
intravenous contrast.
CONTRAST:  100 ml Omnipaque 350.

[Series 5: axial venous · axial · portal-venous · 0.77mm/px · z∈[-354,-162]mm · 8 of 84 slices shown, 13 images]
[im 10/84  soft-tissue]
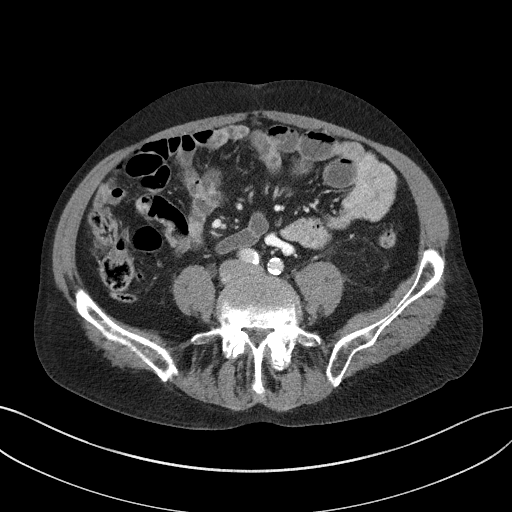
[im 10/84  bone]
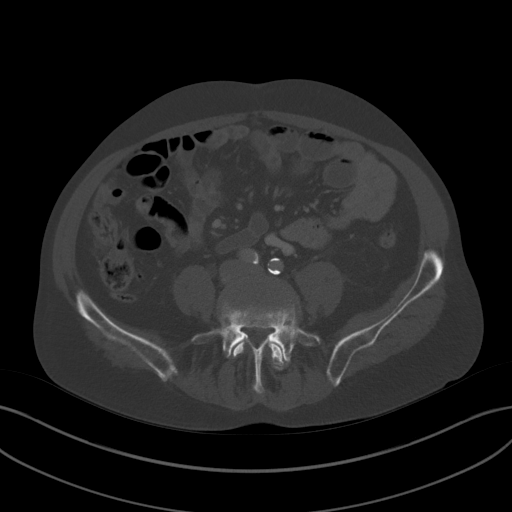
[im 19/84  soft-tissue]
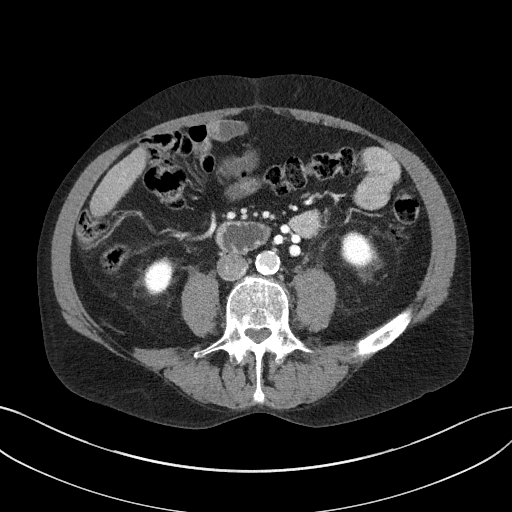
[im 28/84  soft-tissue]
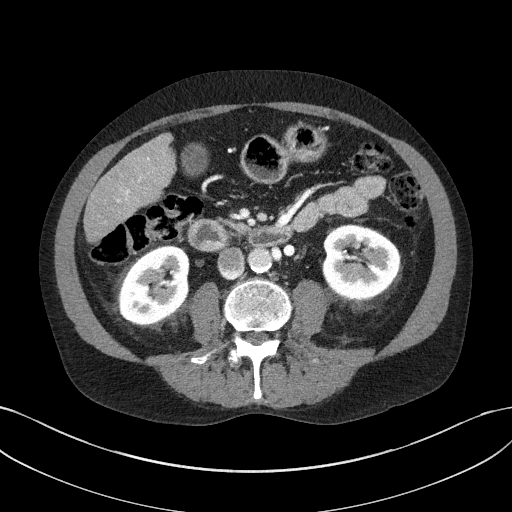
[im 37/84  soft-tissue]
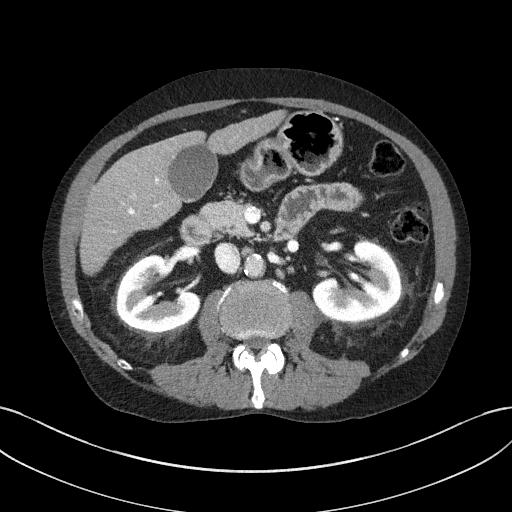
[im 47/84  soft-tissue]
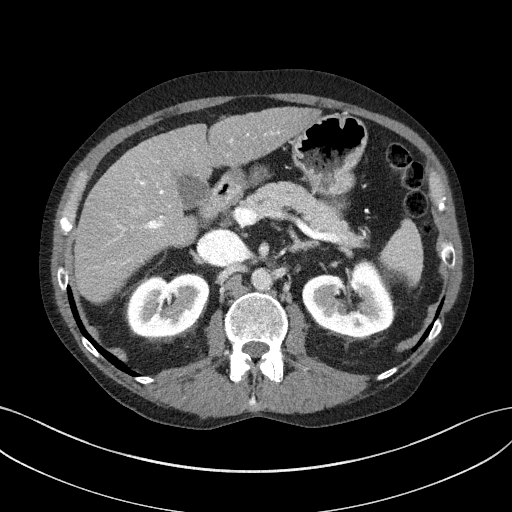
[im 47/84  lung]
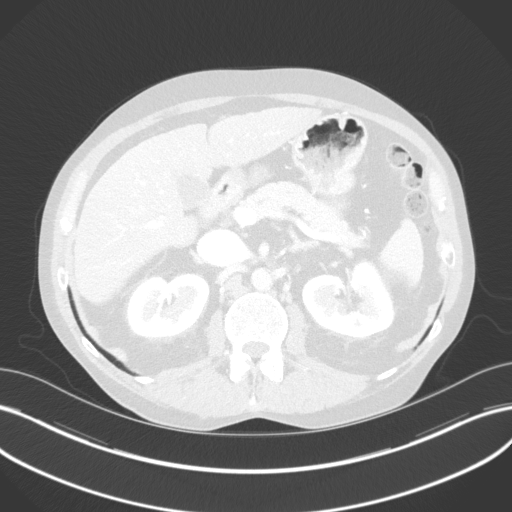
[im 56/84  soft-tissue]
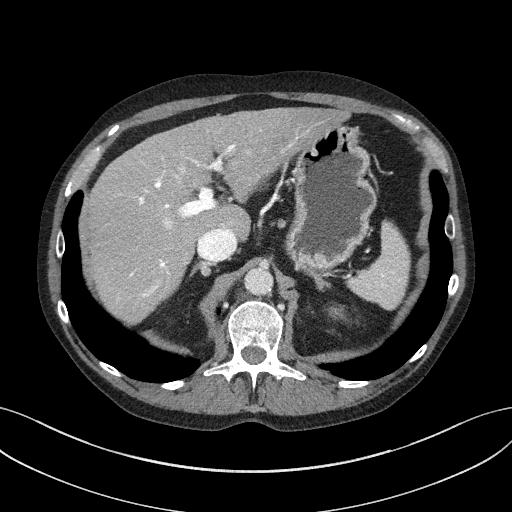
[im 56/84  lung]
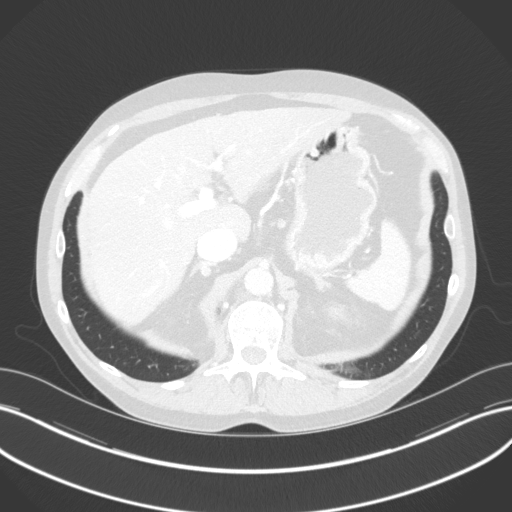
[im 65/84  soft-tissue]
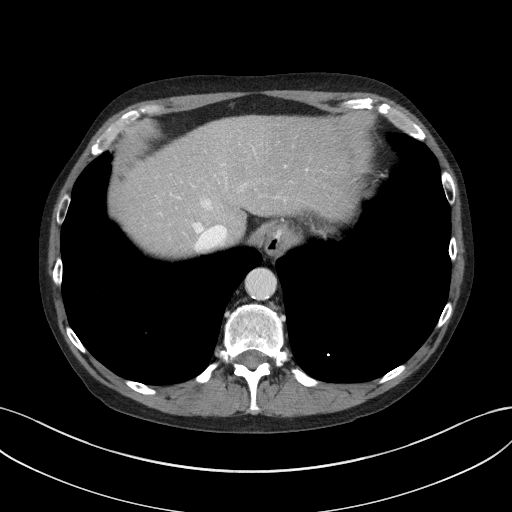
[im 65/84  lung]
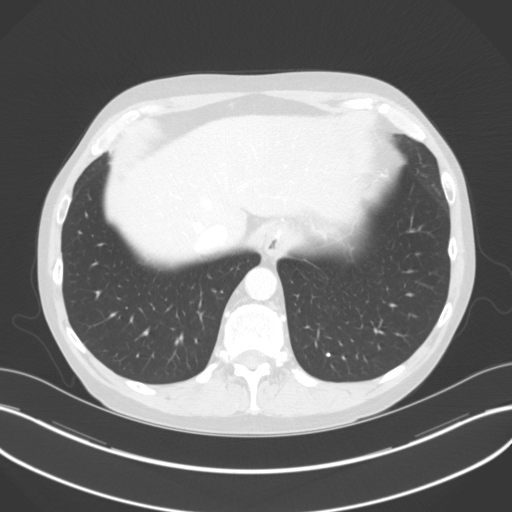
[im 74/84  soft-tissue]
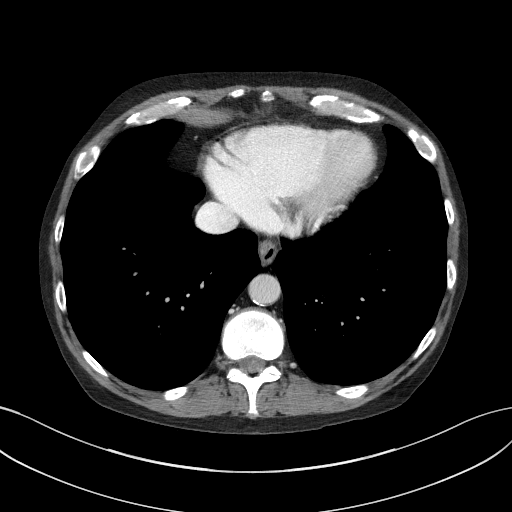
[im 74/84  lung]
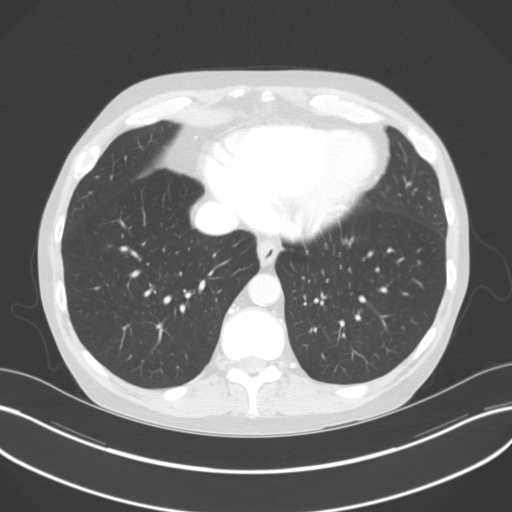

[Series 6: coronal arterial · coronal · arterial · 0.51mm/px · 3 of 100 slices shown, 4 images]
[im 25/100  soft-tissue]
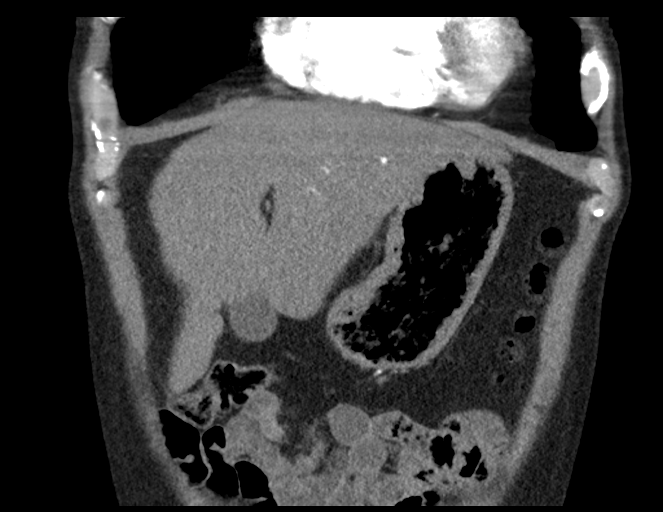
[im 50/100  soft-tissue]
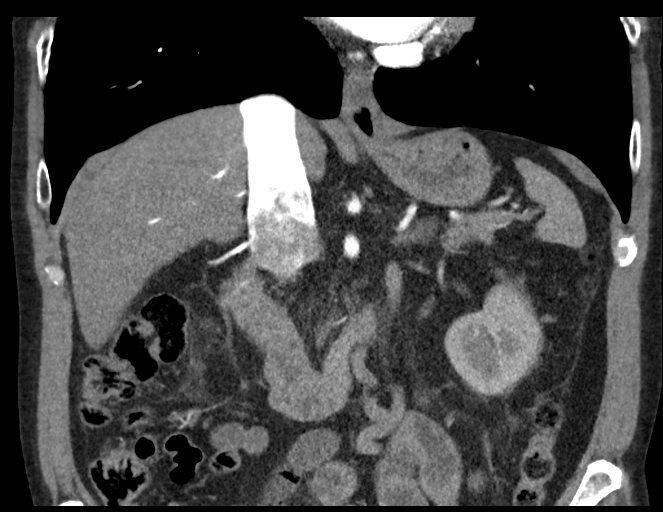
[im 50/100  bone]
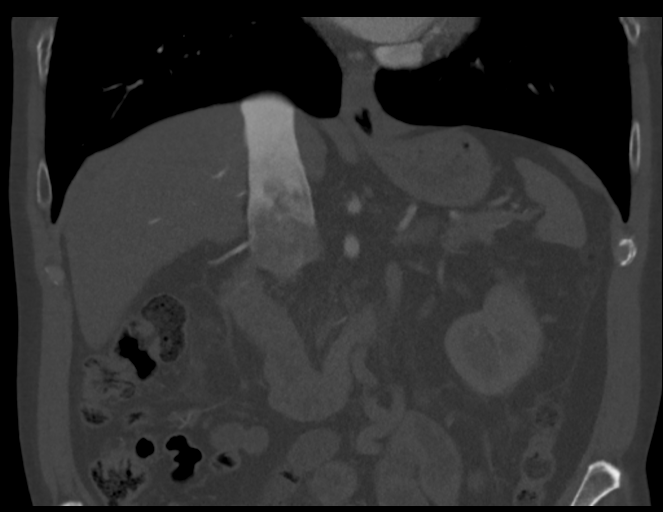
[im 75/100  soft-tissue]
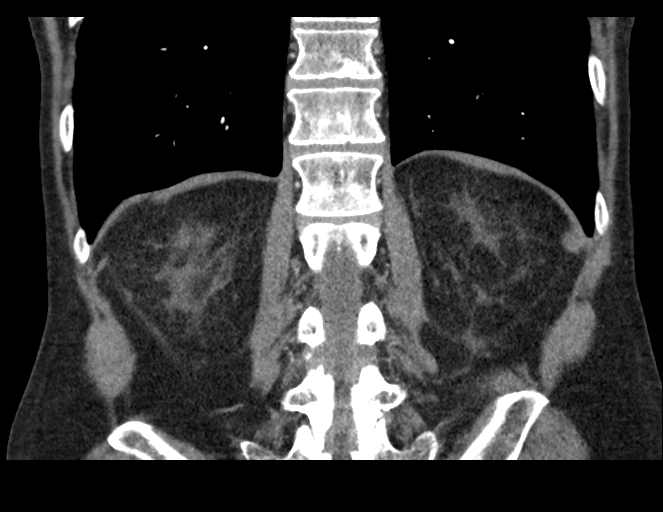

[11 of 46 positions shown; findings below may reference images not displayed]

FINDINGS: Lower chest: Clear lung bases. No significant pleural or pericardial
effusion. Coronary artery calcifications are noted.

Hepatobiliary: The liver demonstrates mild contour irregularity
suspicious for cirrhosis. There is low density throughout the liver
consistent with steatosis. Postcontrast, no focal hepatic
abnormalities are identified. There is prominent reflux of contrast
into the IVC and hepatic veins. Small gallstones are present. No
evidence of gallbladder wall thickening or biliary dilatation.

Pancreas: Unremarkable. No pancreatic ductal dilatation or
surrounding inflammatory changes. No evidence of pancreatic mass.

Spleen: Normal in size without focal abnormality.

Adrenals/Urinary Tract: Both adrenal glands appear normal. Pre
contrast images demonstrate no urinary tract calculi. Post-contrast,
there is no evidence of renal mass or hydronephrosis. The proximal
ureters appear normal on the delayed post-contrast images. There is
symmetric nonspecific perinephric soft tissue stranding bilaterally.

Stomach/Bowel: No evidence of bowel wall thickening, distention or
surrounding inflammatory change.The appendix appears normal.

Vascular/Lymphatic: There are no enlarged abdominal lymph nodes.
Moderate aortoiliac atherosclerosis. As above, prominent reflux of
contrast into the IVC and hepatic veins. There is a dilated left
gonadal vein.

Other: No evidence of abdominal wall mass or hernia.

Musculoskeletal: No acute or significant osseous findings. Mild
lumbar spondylosis.
IMPRESSION: 1. No evidence of pancreatic mass.  The pancreas appears normal.
2. Cholelithiasis without evidence of cholecystitis or biliary
dilatation.
3. Suspected mild cirrhosis and steatosis. No focal hepatic
abnormalities.
4. Nonspecific perinephric soft tissue stranding bilaterally.
5. Prominent reflux of contrast from the right atrium into the IVC
and hepatic veins, suspicious for right heart failure.

## 2016-11-08 ENCOUNTER — Ambulatory Visit: Payer: 59 | Admitting: Cardiology

## 2016-11-29 ENCOUNTER — Ambulatory Visit (INDEPENDENT_AMBULATORY_CARE_PROVIDER_SITE_OTHER): Payer: 59 | Admitting: Cardiology

## 2016-11-29 ENCOUNTER — Encounter: Payer: Self-pay | Admitting: Cardiology

## 2016-11-29 VITALS — BP 128/82 | HR 68 | Ht 71.0 in | Wt 175.8 lb

## 2016-11-29 DIAGNOSIS — I483 Typical atrial flutter: Secondary | ICD-10-CM

## 2016-11-29 DIAGNOSIS — Z8679 Personal history of other diseases of the circulatory system: Secondary | ICD-10-CM

## 2016-11-29 DIAGNOSIS — Z9889 Other specified postprocedural states: Secondary | ICD-10-CM | POA: Diagnosis not present

## 2016-11-29 MED ORDER — LISINOPRIL 5 MG PO TABS: 5.0000 mg | ORAL_TABLET | Freq: Every day | ORAL | 6 refills | Status: DC

## 2016-11-29 NOTE — Progress Notes (Signed)
Electrophysiology Office Note   Date:  11/29/2016   ID:  Derek Lopez, DOB October 13, 1961, MRN [id number]  PCP:  [name], MD  Cardiologist:  [name] Primary Electrophysiologist:  Constance Haw, MD    Chief Complaint  Patient presents with  . Follow-up    Typical Aflutter/4 weeks post ablation     History of Present Illness: Derek Lopez is a 56 y.o. male who presents today for electrophysiology evaluation.   He has a history of cirrhosis, hypertension, and cardiomyopathy with an EF of 45-50%. He had ablation for typical atrial flutter on 09/29/16. He is in sinus rhythm today with a right bundle. He says that he feels well without major complaint. He continues to drink alcohol and smoke quite a bit. He says that he has no interest in quitting.   Today, he denies symptoms of palpitations, chest pain, shortness of breath, orthopnea, PND, lower extremity edema, claudication, dizziness, presyncope, syncope, bleeding, or neurologic sequela. The patient is tolerating medications without difficulties and is otherwise without complaint today.    Past Medical History:  Diagnosis Date  . Cirrhosis ([place])   . Hypertension    Past Surgical History:  Procedure Laterality Date  . APPENDECTOMY    . ELECTROPHYSIOLOGIC STUDY N/A 09/29/2016   Procedure: A-Flutter;  Surgeon: Donovin Kraemer [name], MD;  Location: [facility];  Service: Cardiovascular;  Laterality: N/A;  . TONSILLECTOMY       Current Outpatient Prescriptions  Medication Sig Dispense Refill  . albuterol (PROVENTIL HFA;VENTOLIN HFA) 108 (90 Base) MCG/ACT inhaler Inhale 2 puffs into the lungs every 6 (six) hours as needed for wheezing or shortness of breath. 1 Inhaler 4  . b complex vitamins tablet Take 1 tablet by mouth daily.    Marland Kitchen BREO ELLIPTA 100-25 MCG/INH AEPB INHALE ONE PUFF BY MOUTH ONCE DAILY 60 each 6  . MAGNESIUM PO Take 1 tablet by mouth daily.    . metoprolol (TOPROL-XL) 200 MG 24 hr tablet TAKE  ONE TABLET BY MOUTH AT BEDTIME TAKE  WITH  OR  IMMEDIATELY  FOLLOWING  A  MEAL (Patient taking differently: TAKE 200 MG BY MOUTH AT BEDTIME TAKE  WITH  OR  IMMEDIATELY  FOLLOWING  A  MEAL) 90 tablet 1  . tamsulosin (FLOMAX) 0.4 MG CAPS capsule Take 0.4 mg by mouth daily.    . traMADol (ULTRAM) 50 MG tablet Take 1 tablet (50 mg total) by mouth every 8 (eight) hours as needed. (Patient taking differently: Take 50 mg by mouth every 8 (eight) hours as needed (for headache or migraines). ) 30 tablet 0  . lisinopril (PRINIVIL,ZESTRIL) 5 MG tablet Take 1 tablet (5 mg total) by mouth daily. 30 tablet 6   No current facility-administered medications for this visit.    Facility-Administered Medications Ordered in Other Visits  Medication Dose Route Frequency Provider Last Rate Last Dose  . iohexol (OMNIPAQUE) 350 MG/ML injection 100 mL  100 mL Intravenous Once PRN Hali Marry, MD        Allergies:   Penicillins   Social History:  The patient  reports that he has been smoking Cigarettes.  He has been smoking about 1.00 pack per day. He has never used smokeless tobacco. He reports that he drinks about 29.4 oz of alcohol per week . He reports that he does not use drugs.   Family History:  The patient's family history includes Cancer in his mother.    ROS:  Please see the history of present illness.  Otherwise, review of systems is positive for none.   All other systems are reviewed and negative.    PHYSICAL EXAM: VS:  BP 128/82 (BP Location: Right Arm)   Pulse 68   Ht 5\' 11"  (1.803 m)   Wt 175 lb 12.8 oz (79.7 kg)   BMI 24.52 kg/m  , BMI Body mass index is 24.52 kg/m. GEN: Well nourished, well developed, in no acute distress  HEENT: normal  Neck: no JVD, carotid bruits, or masses Cardiac: RRR; no murmurs, rubs, or gallops,no edema  Respiratory:  clear to auscultation bilaterally, normal work of breathing GI: soft, nontender, nondistended, + BS MS: no deformity or atrophy  Skin: warm  and dry Neuro:  Strength and sensation are intact Psych: euthymic mood, full affect  EKG:  EKG is ordered today. Personal review of the ekg ordered shows sinus rhythm, RBBB, rate 68  Recent Labs: 03/24/2016: ALT 19; Brain Natriuretic Peptide 138.3; TSH 2.11 04/24/2016: BUN 4; Creat 0.58; Potassium 3.9; Sodium 134    Lipid Panel     Component Value Date/Time   CHOL 200 01/11/2016 1548   TRIG 65 01/11/2016 1548   HDL 91 01/11/2016 1548   CHOLHDL 2.2 01/11/2016 1548   VLDL 13 01/11/2016 1548   LDLCALC 96 01/11/2016 1548     Wt Readings from Last 3 Encounters:  11/29/16 175 lb 12.8 oz (79.7 kg)  09/30/16 175 lb 4.3 oz (79.5 kg)  07/26/16 167 lb 12.8 oz (76.1 kg)      Other studies Reviewed: Additional studies/ records that were reviewed today include: TTE 04/12/16 Review of the above records today demonstrates:  - Left ventricle: The cavity size was normal. Systolic function was   mildly reduced. The estimated ejection fraction was in the range   of 45% to 50%. Diffuse hypokinesis. The study is not technically   sufficient to allow evaluation of LV diastolic function. - Aortic valve: Transvalvular velocity was within the normal range.   There was no stenosis. There was no regurgitation. - Mitral valve: Transvalvular velocity was within the normal range.   There was no evidence for stenosis. There was trivial   regurgitation. - Left atrium: The atrium was severely dilated. - Right ventricle: The cavity size was normal. Wall thickness was   normal. Systolic function was normal. - Atrial septum: No defect or patent foramen ovale was identified   by color flow Doppler. - Tricuspid valve: There was mild regurgitation. - Pulmonary arteries: Systolic pressure was within the normal   range. PA peak pressure: 29 mm Hg (S).   ASSESSMENT AND PLAN:  1.  Atrial flutter: Currently on Eliquis. Ablation on 09/29/16. He is in sinus rhythm today with a right bundle branch block which is  old. We'll therefore stop his Eliquis.  This patients CHA2DS2-VASc Score and unadjusted Ischemic Stroke Rate (% per year) is equal to 2.2 % stroke rate/year from a score of 2  Above score calculated as 1 point each if present [CHF, HTN, DM, Vascular=MI/PAD/Aortic Plaque, Age if 65-74, or Male] Above score calculated as 2 points each if present [Age > 75, or Stroke/TIA/TE]  2. Hypertension: Well-controlled today  3. 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 He has not had overt chest pain. He does have episodes of chest pain while at rest, but these are quite atypical and likely did not need further investigation. His EF is 45-50%. Due to his mildly depressed ejection fraction, we'll stop his Norvasc and start him on  lisinopril 5 mg. He does have a history of cirrhosis and with his declining systolic function, it is also possible that his alcohol intake is causing his systolic dysfunction. I have counseled him on cessation of alcohol, but he states that he is not interested in stopping and Derek Lopez continue to drink alcohol.  3. Substance abuse: Heavy alcohol abuse as well as tobacco abuse. Counseled on cessation.     Current medicines are reviewed at length with the patient today.   The patient does not have concerns regarding his medicines.  The following changes were made today:  Eliquis, stop Norvasc, start lisinopril  Labs/ tests ordered today include:  Orders Placed This Encounter  Procedures  . EKG 12-Lead     Disposition:   FU with Derek Lopez 6 months  Signed, Len Kluver [name], MD  11/29/2016 2:32 PM     [facility] [address] [place] [address] [phone] (office) [phone] (fax)

## 2016-11-29 NOTE — Patient Instructions (Signed)
Medication Instructions:    Your physician has recommended you make the following change in your medication:  1) STOP Amlodipine  2) STOP Eliquis  3) START Lisinopril 5 mg once daily  --- If you need a refill on your cardiac medications before your next appointment, please call your pharmacy. ---  Labwork:  None ordered  Testing/Procedures:  None ordered  Follow-Up:  Your physician wants you to follow-up in: 6 months with Dr. Curt Bears.  You will receive a reminder letter in the mail two months in advance. If you don't receive a letter, please call our office to schedule the follow-up appointment.   Thank you for choosing [facility]!!   Trinidad Curet, RN [phone]  Any Other Special Instructions Will Be Listed Below (If Applicable).  Lisinopril tablets What is this medicine? LISINOPRIL (lyse IN oh pril) is an ACE inhibitor. This medicine is used to treat high blood pressure and heart failure. It is also used to protect the heart immediately after a heart attack. This medicine may be used for other purposes; ask your health care provider or pharmacist if you have questions. COMMON BRAND NAME(S): Prinivil, Zestril What should I tell my health care provider before I take this medicine? They need to know if you have any of these conditions: -diabetes -heart or blood vessel disease -kidney disease -low blood pressure -previous swelling of the tongue, face, or lips with difficulty breathing, difficulty swallowing, hoarseness, or tightening of the throat -an unusual or allergic reaction to lisinopril, other ACE inhibitors, insect venom, foods, dyes, or preservatives -pregnant or trying to get pregnant -breast-feeding How should I use this medicine? Take this medicine by mouth with a glass of water. Follow the directions on your prescription label. You may take this medicine with or without food. If it upsets your stomach, take it with food. Take your medicine at regular  intervals. Do not take it more often than directed. Do not stop taking except on your doctor's advice. Talk to your pediatrician regarding the use of this medicine in children. Special care may be needed. While this drug may be prescribed for children as young as 39 years of age for selected conditions, precautions do apply. Overdosage: If you think you have taken too much of this medicine contact a poison control center or emergency room at once. NOTE: This medicine is only for you. Do not share this medicine with others. What if I miss a dose? If you miss a dose, take it as soon as you can. If it is almost time for your next dose, take only that dose. Do not take double or extra doses. What may interact with this medicine? Do not take this medicine with any of the following medications: -hymenoptera venom -sacubitril; valsartan This medicines may also interact with the following medications: -aliskiren -angiotensin receptor blockers, like losartan or valsartan -certain medicines for diabetes -diuretics -everolimus -gold compounds -lithium -NSAIDs, medicines for pain and inflammation, like ibuprofen or naproxen -potassium salts or supplements -salt substitutes -sirolimus -temsirolimus This list may not describe all possible interactions. Give your health care provider a list of all the medicines, herbs, non-prescription drugs, or dietary supplements you use. Also tell them if you smoke, drink alcohol, or use illegal drugs. Some items may interact with your medicine. What should I watch for while using this medicine? Visit your doctor or health care professional for regular check ups. Check your blood pressure as directed. Ask your doctor what your blood pressure should be, and when  you should contact him or her. Do not treat yourself for coughs, colds, or pain while you are using this medicine without asking your doctor or health care professional for advice. Some ingredients may increase  your blood pressure. Women should inform their doctor if they wish to become pregnant or think they might be pregnant. There is a potential for serious side effects to an unborn child. Talk to your health care professional or pharmacist for more information. Check with your doctor or health care professional if you get an attack of severe diarrhea, nausea and vomiting, or if you sweat a lot. The loss of too much body fluid can make it dangerous for you to take this medicine. You may get drowsy or dizzy. Do not drive, use machinery, or do anything that needs mental alertness until you know how this drug affects you. Do not stand or sit up quickly, especially if you are an older patient. This reduces the risk of dizzy or fainting spells. Alcohol can make you more drowsy and dizzy. Avoid alcoholic drinks. Avoid salt substitutes unless you are told otherwise by your doctor or health care professional. What side effects may I notice from receiving this medicine? Side effects that you should report to your doctor or health care professional as soon as possible: -allergic reactions like skin rash, itching or hives, swelling of the hands, feet, face, lips, throat, or tongue -breathing problems -signs and symptoms of kidney injury like trouble passing urine or change in the amount of urine -signs and symptoms of increased potassium like muscle weakness; chest pain; or fast, irregular heartbeat -signs and symptoms of liver injury like dark yellow or brown urine; general ill feeling or flu-like symptoms; light-colored stools; loss of appetite; nausea; right upper belly pain; unusually weak or tired; yellowing of the eyes or skin -signs and symptoms of low blood pressure like dizziness; feeling faint or lightheaded, falls; unusually weak or tired -stomach pain with or without nausea and vomiting Side effects that usually do not require medical attention (report to your doctor or health care professional if they  continue or are bothersome): -changes in taste -cough -dizziness -fever -headache -sensitivity to light This list may not describe all possible side effects. Call your doctor for medical advice about side effects. You may report side effects to FDA at 1-800-FDA-1088. Where should I keep my medicine? Keep out of the reach of children. Store at room temperature between 15 and 30 degrees C (59 and 86 degrees F). Protect from moisture. Keep container tightly closed. Throw away any unused medicine after the expiration date. NOTE: This sheet is a summary. It may not cover all possible information. If you have questions about this medicine, talk to your doctor, pharmacist, or health care provider.  2017 Elsevier/Gold Standard (2015-11-29 12:52:35)

## 2016-12-14 ENCOUNTER — Ambulatory Visit (INDEPENDENT_AMBULATORY_CARE_PROVIDER_SITE_OTHER): Payer: 59

## 2016-12-14 ENCOUNTER — Ambulatory Visit (INDEPENDENT_AMBULATORY_CARE_PROVIDER_SITE_OTHER): Payer: 59 | Admitting: Family Medicine

## 2016-12-14 ENCOUNTER — Encounter: Payer: Self-pay | Admitting: Family Medicine

## 2016-12-14 ENCOUNTER — Other Ambulatory Visit: Payer: Self-pay | Admitting: Family Medicine

## 2016-12-14 VITALS — BP 158/77 | HR 77 | Ht 71.0 in

## 2016-12-14 DIAGNOSIS — R0789 Other chest pain: Secondary | ICD-10-CM

## 2016-12-14 DIAGNOSIS — S2242XA Multiple fractures of ribs, left side, initial encounter for closed fracture: Secondary | ICD-10-CM

## 2016-12-14 DIAGNOSIS — R2681 Unsteadiness on feet: Secondary | ICD-10-CM

## 2016-12-14 DIAGNOSIS — R55 Syncope and collapse: Secondary | ICD-10-CM | POA: Diagnosis not present

## 2016-12-14 DIAGNOSIS — Z8679 Personal history of other diseases of the circulatory system: Secondary | ICD-10-CM

## 2016-12-14 DIAGNOSIS — W19XXXA Unspecified fall, initial encounter: Secondary | ICD-10-CM

## 2016-12-14 DIAGNOSIS — R0781 Pleurodynia: Secondary | ICD-10-CM

## 2016-12-14 DIAGNOSIS — R296 Repeated falls: Secondary | ICD-10-CM

## 2016-12-14 NOTE — Progress Notes (Signed)
Subjective:    Patient ID: Derek Lopez, male    DOB: [birth year], 56 y.o.   MRN: [id number]  HPI  56 year old male with history of alcohol abuse comes in today because he actually fell off the toilet and hit his left side on Monday ( 4 days ago) . He fell into the shower and his his left ribs. Having to sleep in the recliner.  Painful when raises his arm. Pain 5/10, sharp.  He has felt off balance. Off his amlodipine and Eliquis after recent ablation for atrial flutter.    He says he really doesn't think he hit his head anything see passed out and then fell off the toilet. His wife is here with him today says he was really just out for a few seconds area he's not sure if the syncope was after a bowel movement or just urination. They've actually just got back to the house after visiting a friend when he went in and went to the bathroom. Sometimes because he is unstable on his feet he will actually sit down to urinate. His wife says that he actually had been doing really well up until he fell now he's been sitting more because it's very uncomfortable to move because his side hurts and so he has seemed to be almost more unsteady with his gait than usual.  He did use a heating pad and on Tuesday night took an Aleve.  Review of Systems  BP (!) 158/77   Pulse 77   Ht 5\' 11"  (1.803 m)   SpO2 95%     Allergies  Allergen Reactions  . Penicillins Rash    Past Medical History:  Diagnosis Date  . Cirrhosis ([place])   . Hypertension     Past Surgical History:  Procedure Laterality Date  . APPENDECTOMY    . ELECTROPHYSIOLOGIC STUDY N/A 09/29/2016   Procedure: A-Flutter;  Surgeon: Will [name], MD;  Location: [facility];  Service: Cardiovascular;  Laterality: N/A;  . TONSILLECTOMY      Social History   Social History  . Marital status: Married    Spouse name: N/A  . Number of children: 2  . Years of education: N/A   Occupational History  . Retired    Social History  Main Topics  . Smoking status: Current Every Day Smoker    Packs/day: 1.00    Types: Cigarettes  . Smokeless tobacco: Never Used  . Alcohol use 29.4 oz/week    49 Cans of beer per week     Comment: 12 pack per day  . Drug use: No  . Sexual activity: Yes    Partners: Female   Other Topics Concern  . Not on file   Social History Narrative   Says walk daily.  .  Drink coffe in the AM, 2 cups.      Family History  Problem Relation Age of Onset  . Cancer Mother     Outpatient Encounter Prescriptions as of 12/14/2016  Medication Sig  . albuterol (PROVENTIL HFA;VENTOLIN HFA) 108 (90 Base) MCG/ACT inhaler Inhale 2 puffs into the lungs every 6 (six) hours as needed for wheezing or shortness of breath.  Marland Kitchen b complex vitamins tablet Take 1 tablet by mouth daily.  Marland Kitchen BREO ELLIPTA 100-25 MCG/INH AEPB INHALE ONE PUFF BY MOUTH ONCE DAILY  . lisinopril (PRINIVIL,ZESTRIL) 5 MG tablet Take 1 tablet (5 mg total) by mouth daily.  Marland Kitchen MAGNESIUM PO Take 1 tablet by mouth daily.  . metoprolol (  TOPROL-XL) 200 MG 24 hr tablet TAKE ONE TABLET BY MOUTH AT BEDTIME TAKE  WITH  OR  IMMEDIATELY  FOLLOWING  A  MEAL (Patient taking differently: TAKE 200 MG BY MOUTH AT BEDTIME TAKE  WITH  OR  IMMEDIATELY  FOLLOWING  A  MEAL)  . tamsulosin (FLOMAX) 0.4 MG CAPS capsule Take 0.4 mg by mouth daily.  . traMADol (ULTRAM) 50 MG tablet Take 1 tablet (50 mg total) by mouth every 8 (eight) hours as needed. (Patient taking differently: Take 50 mg by mouth every 8 (eight) hours as needed (for headache or migraines). )  . [DISCONTINUED] iohexol (OMNIPAQUE) 350 MG/ML injection 100 mL    No facility-administered encounter medications on file as of 12/14/2016.        Objective:   Physical Exam  Constitutional: He is oriented to person, place, and time. He appears well-developed and well-nourished.  He is sitting in a wheelchair today. Opted not to do a gait test today.  HENT:  Head: Normocephalic and atraumatic.   Cardiovascular: Normal rate, regular rhythm and normal heart sounds.   Pulmonary/Chest: Effort normal and breath sounds normal.  Neurological: He is alert and oriented to person, place, and time.  Skin: Skin is warm and dry.   Horizontally by 2-3 inches vertically. He is very tender over that area and actually pulled away from me during the exam.  Psychiatric: He has a normal mood and affect. His behavior is normal.       Assessment & Plan:  Syncope-after micturition-unclear etiology at this point. May just be vasovagal. Unclear about what his hydration status may have been that day. He did not injure his head but does have a significant bruise on his left side so we'll get an x-ray today just to make sure that he doesn't have a fractured rib. Next  Left flank pain status post impact injury-will get x-ray to rule out rib fracture.  Gait instability and frequent falls-we'll go ahead and check his magnesium thiamine because of his history of alcohol use, electrolytes etc. So check A1c.

## 2016-12-15 ENCOUNTER — Other Ambulatory Visit: Payer: Self-pay | Admitting: Family Medicine

## 2016-12-15 LAB — COMPREHENSIVE METABOLIC PANEL
ALT: 28 U/L (ref 9–46)
AST: 54 U/L — AB (ref 10–35)
Albumin: 4.4 g/dL (ref 3.6–5.1)
Alkaline Phosphatase: 101 U/L (ref 40–115)
BILIRUBIN TOTAL: 1.8 mg/dL — AB (ref 0.2–1.2)
BUN: 9 mg/dL (ref 7–25)
CO2: 21 mmol/L (ref 20–31)
CREATININE: 0.61 mg/dL — AB (ref 0.70–1.33)
Calcium: 9.7 mg/dL (ref 8.6–10.3)
Chloride: 87 mmol/L — ABNORMAL LOW (ref 98–110)
GLUCOSE: 107 mg/dL — AB (ref 65–99)
Potassium: 3.9 mmol/L (ref 3.5–5.3)
SODIUM: 124 mmol/L — AB (ref 135–146)
Total Protein: 8 g/dL (ref 6.1–8.1)

## 2016-12-15 LAB — CBC WITH DIFFERENTIAL/PLATELET
BASOS ABS: 0 {cells}/uL (ref 0–200)
BASOS PCT: 0 %
EOS ABS: 0 {cells}/uL — AB (ref 15–500)
Eosinophils Relative: 0 %
HEMATOCRIT: 42.8 % (ref 38.5–50.0)
Hemoglobin: 15.5 g/dL (ref 13.2–17.1)
LYMPHS PCT: 18 %
Lymphs Abs: 1620 cells/uL (ref 850–3900)
MCH: 35.6 pg — AB (ref 27.0–33.0)
MCHC: 36.2 g/dL — ABNORMAL HIGH (ref 32.0–36.0)
MCV: 98.2 fL (ref 80.0–100.0)
MONO ABS: 1710 {cells}/uL — AB (ref 200–950)
MONOS PCT: 19 %
MPV: 10.4 fL (ref 7.5–12.5)
Neutro Abs: 5670 cells/uL (ref 1500–7800)
Neutrophils Relative %: 63 %
Platelets: 128 10*3/uL — ABNORMAL LOW (ref 140–400)
RBC: 4.36 MIL/uL (ref 4.20–5.80)
RDW: 13 % (ref 11.0–15.0)
WBC: 9 10*3/uL (ref 3.8–10.8)

## 2016-12-15 LAB — HEMOGLOBIN A1C
HEMOGLOBIN A1C: 5.1 % (ref <5.7)
MEAN PLASMA GLUCOSE: 100 mg/dL

## 2016-12-15 LAB — AMMONIA: AMMONIA: 37 umol/L (ref <=47)

## 2016-12-15 LAB — MAGNESIUM: Magnesium: 2.1 mg/dL (ref 1.5–2.5)

## 2016-12-17 LAB — ALCOHOL, ETHYL QN, RANDOM UR: ALCOHOL, ETHYL, QN, RANDOM UR: 22 mg/dL

## 2016-12-18 ENCOUNTER — Other Ambulatory Visit: Payer: Self-pay | Admitting: Family Medicine

## 2016-12-18 ENCOUNTER — Telehealth: Payer: Self-pay

## 2016-12-18 DIAGNOSIS — E871 Hypo-osmolality and hyponatremia: Secondary | ICD-10-CM

## 2016-12-18 NOTE — Telephone Encounter (Signed)
BMP already in system

## 2016-12-19 LAB — BASIC METABOLIC PANEL WITH GFR
BUN: 7 mg/dL (ref 7–25)
CALCIUM: 8.2 mg/dL — AB (ref 8.6–10.3)
CO2: 24 mmol/L (ref 20–31)
CREATININE: 0.57 mg/dL — AB (ref 0.70–1.33)
Chloride: 86 mmol/L — ABNORMAL LOW (ref 98–110)
GFR, Est African American: >89 mL/min (ref >=60)
GFR, Est Non African American: >89 mL/min (ref >=60)
Glucose, Bld: 94 mg/dL (ref 65–99)
Potassium: 3.3 mmol/L — ABNORMAL LOW (ref 3.5–5.3)
Sodium: 125 mmol/L — ABNORMAL LOW (ref 135–146)

## 2016-12-19 LAB — VITAMIN B1: Vitamin B1 (Thiamine): 34 nmol/L — ABNORMAL HIGH (ref 8–30)

## 2016-12-20 NOTE — Telephone Encounter (Signed)
Patient advised.

## 2016-12-25 ENCOUNTER — Other Ambulatory Visit: Payer: Self-pay

## 2016-12-25 DIAGNOSIS — E871 Hypo-osmolality and hyponatremia: Secondary | ICD-10-CM

## 2016-12-26 ENCOUNTER — Other Ambulatory Visit: Payer: Self-pay | Admitting: *Deleted

## 2016-12-26 DIAGNOSIS — E871 Hypo-osmolality and hyponatremia: Secondary | ICD-10-CM

## 2016-12-26 LAB — COMPLETE METABOLIC PANEL WITH GFR
ALBUMIN: 3.6 g/dL (ref 3.6–5.1)
ALK PHOS: 108 U/L (ref 40–115)
ALT: 25 U/L (ref 9–46)
AST: 33 U/L (ref 10–35)
BILIRUBIN TOTAL: 0.6 mg/dL (ref 0.2–1.2)
BUN: 4 mg/dL — AB (ref 7–25)
CO2: 25 mmol/L (ref 20–31)
Calcium: 9 mg/dL (ref 8.6–10.3)
Chloride: 99 mmol/L (ref 98–110)
Creat: 0.5 mg/dL — ABNORMAL LOW (ref 0.70–1.33)
GFR, Est African American: >89 mL/min (ref >=60)
GFR, Est Non African American: >89 mL/min (ref >=60)
GLUCOSE: 98 mg/dL (ref 65–99)
Potassium: 4 mmol/L (ref 3.5–5.3)
SODIUM: 132 mmol/L — AB (ref 135–146)
TOTAL PROTEIN: 6.6 g/dL (ref 6.1–8.1)

## 2016-12-26 NOTE — Addendum Note (Signed)
Addended by: Teddy Spike on: 12/26/2016 06:41 PM   Modules accepted: Orders

## 2016-12-30 ENCOUNTER — Other Ambulatory Visit: Payer: Self-pay | Admitting: Family Medicine

## 2017-01-04 ENCOUNTER — Other Ambulatory Visit: Payer: Self-pay | Admitting: Family Medicine

## 2017-01-04 ENCOUNTER — Ambulatory Visit: Payer: 59 | Admitting: Family Medicine

## 2017-01-05 ENCOUNTER — Encounter: Payer: Self-pay | Admitting: Family Medicine

## 2017-01-05 ENCOUNTER — Ambulatory Visit (INDEPENDENT_AMBULATORY_CARE_PROVIDER_SITE_OTHER): Payer: 59 | Admitting: Family Medicine

## 2017-01-05 VITALS — BP 125/69 | HR 71 | Ht 71.0 in | Wt 165.0 lb

## 2017-01-05 DIAGNOSIS — Z23 Encounter for immunization: Secondary | ICD-10-CM | POA: Diagnosis not present

## 2017-01-05 DIAGNOSIS — I10 Essential (primary) hypertension: Secondary | ICD-10-CM

## 2017-01-05 DIAGNOSIS — Z720 Tobacco use: Secondary | ICD-10-CM

## 2017-01-05 DIAGNOSIS — S2242XA Multiple fractures of ribs, left side, initial encounter for closed fracture: Secondary | ICD-10-CM | POA: Diagnosis not present

## 2017-01-05 MED ORDER — NICOTINE POLACRILEX 4 MG MT GUM: 4.0000 mg | CHEWING_GUM | OROMUCOSAL | 0 refills | Status: DC | PRN

## 2017-01-05 MED ORDER — NICOTINE 21 MG/24HR TD PT24
21.0000 mg | MEDICATED_PATCH | Freq: Every day | TRANSDERMAL | 0 refills | Status: DC
Start: 2017-01-05 — End: 2017-04-20

## 2017-01-05 MED ORDER — TAMSULOSIN HCL 0.4 MG PO CAPS: 0.4000 mg | ORAL_CAPSULE | Freq: Every day | ORAL | 1 refills | Status: DC

## 2017-01-05 NOTE — Progress Notes (Signed)
Subjective:    CC: HTN  HPI:  Hypertension- Pt denies chest pain, SOB, dizziness, or heart palpitations.  Taking meds as directed w/o problems.  Denies medication side effects.  He was here 6 weeks ago after recent fall his blood pressure was quite elevated so we asked him to come back in today.  Follow-up rib fracture-read also got an x-ray after recent fall and he did fracture the eighth and 10th ribs on the left side. There was also evidence of a posterior lateral third rib fracture but unsure how old it was.He actually says his pain is a lot better than the bruising is completely healed. He's not had any palms or complications.   He would also like to work on quitting smoking. He used to nicotine patch will he was in the hospital and would like to try this again as an outpatient. He currently smokes a little over one pack a day. He typically smokes within the first hour after waking up and often times will smoke at least 1 cigarette in the middle the night.  Past medical history, Surgical history, Family history not pertinant except as noted below, Social history, Allergies, and medications have been entered into the medical record, reviewed, and corrections made.   Review of Systems: No fevers, chills, night sweats, weight loss, chest pain, or shortness of breath.   Objective:    General: Well Developed, well nourished, and in no acute distress.  Neuro: Alert and oriented x3, extra-ocular muscles intact, sensation grossly intact.  HEENT: Normocephalic, atraumatic  Skin: Warm and dry, no rashes. Cardiac: Regular rate and rhythm, no murmurs rubs or gallops, no lower extremity edema.  Respiratory: Clear to auscultation bilaterally. Not using accessory muscles, speaking in full sentences.   Impression and Recommendations:    HTN - Well controlled. Continue current regimen. Follow up in  6 months.   Rib fracture-overall he is doing much better and is healing well. Is really not even  tender on exam today.  Tobacco abuse-discussed smoking cessation using an NicoDerm patch as well as using the gum or lozenges bor breakthrough sxs. Gave him a handout on how to taper off.

## 2017-01-05 NOTE — Patient Instructions (Signed)
Go for repeat labs in June

## 2017-02-15 ENCOUNTER — Other Ambulatory Visit: Payer: Self-pay | Admitting: Family Medicine

## 2017-03-17 ENCOUNTER — Other Ambulatory Visit: Payer: Self-pay | Admitting: Family Medicine

## 2017-04-05 ENCOUNTER — Ambulatory Visit: Payer: 59 | Admitting: Family Medicine

## 2017-04-11 DIAGNOSIS — S42022A Displaced fracture of shaft of left clavicle, initial encounter for closed fracture: Secondary | ICD-10-CM | POA: Insufficient documentation

## 2017-04-18 DIAGNOSIS — Z9889 Other specified postprocedural states: Secondary | ICD-10-CM | POA: Insufficient documentation

## 2017-04-18 DIAGNOSIS — Z8781 Personal history of (healed) traumatic fracture: Secondary | ICD-10-CM

## 2017-04-20 ENCOUNTER — Encounter: Payer: Self-pay | Admitting: Family Medicine

## 2017-04-20 ENCOUNTER — Ambulatory Visit (INDEPENDENT_AMBULATORY_CARE_PROVIDER_SITE_OTHER): Payer: 59 | Admitting: Family Medicine

## 2017-04-20 VITALS — BP 123/64 | HR 80 | Ht 71.0 in | Wt 164.0 lb

## 2017-04-20 DIAGNOSIS — I10 Essential (primary) hypertension: Secondary | ICD-10-CM | POA: Diagnosis not present

## 2017-04-20 DIAGNOSIS — Z720 Tobacco use: Secondary | ICD-10-CM | POA: Diagnosis not present

## 2017-04-20 DIAGNOSIS — J410 Simple chronic bronchitis: Secondary | ICD-10-CM

## 2017-04-20 DIAGNOSIS — I700 Atherosclerosis of aorta: Secondary | ICD-10-CM | POA: Diagnosis not present

## 2017-04-20 MED ORDER — METOPROLOL SUCCINATE ER 200 MG PO TB24: ORAL_TABLET | ORAL | 1 refills | Status: DC

## 2017-04-20 MED ORDER — LISINOPRIL 5 MG PO TABS: 5.0000 mg | ORAL_TABLET | Freq: Every day | ORAL | 1 refills | Status: DC

## 2017-04-20 NOTE — Progress Notes (Signed)
Subjective:    CC: COPD  HPI:  Hypertension- Pt denies chest pain, SOB, dizziness, or heart palpitations.  Taking meds as directed w/o problems.  Denies medication side effects.    COPD-currently on breo Daily.No recent flares or exacerbations. When I last saw him we discussed smoking cessation. He wanted to try the nicotine patch. He says he never did try it. And now that he recently had surgery on his collarbone after a fracture, after falling into a parked bench he opts to wait before he tries to quit smoking.  Atherosclerosis of the aorta - not currently on a statin Lab Results  Component Value Date   CHOL 200 01/11/2016   HDL 91 01/11/2016   LDLCALC 96 01/11/2016   TRIG 65 01/11/2016   CHOLHDL 2.2 01/11/2016     Past medical history, Surgical history, Family history not pertinant except as noted below, Social history, Allergies, and medications have been entered into the medical record, reviewed, and corrections made.   Review of Systems: No fevers, chills, night sweats, weight loss, chest pain, or shortness of breath.   Objective:    General: Well Developed, well nourished, and in no acute distress.  Neuro: Alert and oriented x3, extra-ocular muscles intact, sensation grossly intact.  HEENT: Normocephalic, atraumatic  Skin: Warm and dry, no rashes. Cardiac: Regular rate and rhythm, no murmurs rubs or gallops, no lower extremity edema.  Respiratory: slight inspiratory wheeze at the bases bilaterally. . Not using accessory muscles, speaking in full sentences.   Impression and Recommendations:    HTN - Well controlled. Continue current regimen. Follow up in  6 months.   Atherosclerosis of the aorta - discussed considering statin even though lipids are normal.    COPD-stable. No recent flares or exacerbations.  Tob abuse - encouraged cessation.  Explained that quitting smoking actually improve his wound healing and encouraged him to try. He Artie has the nicotine patches  at home.  Will do first shingles vaccine at next OV.

## 2017-04-21 LAB — COMPLETE METABOLIC PANEL WITH GFR
ALBUMIN: 4.2 g/dL (ref 3.6–5.1)
ALK PHOS: 98 U/L (ref 40–115)
ALT: 49 U/L — ABNORMAL HIGH (ref 9–46)
AST: 74 U/L — ABNORMAL HIGH (ref 10–35)
BILIRUBIN TOTAL: 1.1 mg/dL (ref 0.2–1.2)
BUN: 4 mg/dL — ABNORMAL LOW (ref 7–25)
CO2: 25 mmol/L (ref 20–31)
Calcium: 9.1 mg/dL (ref 8.6–10.3)
Chloride: 89 mmol/L — ABNORMAL LOW (ref 98–110)
Creat: 0.56 mg/dL — ABNORMAL LOW (ref 0.70–1.33)
Glucose, Bld: 77 mg/dL (ref 65–99)
Potassium: 3.6 mmol/L (ref 3.5–5.3)
Sodium: 125 mmol/L — ABNORMAL LOW (ref 135–146)
Total Protein: 6.9 g/dL (ref 6.1–8.1)

## 2017-09-22 ENCOUNTER — Other Ambulatory Visit: Payer: Self-pay | Admitting: Family Medicine

## 2017-10-09 ENCOUNTER — Ambulatory Visit (INDEPENDENT_AMBULATORY_CARE_PROVIDER_SITE_OTHER): Payer: 59 | Admitting: Family Medicine

## 2017-10-09 ENCOUNTER — Encounter: Payer: Self-pay | Admitting: Family Medicine

## 2017-10-09 VITALS — BP 143/79 | HR 66 | Ht 71.0 in | Wt 159.0 lb

## 2017-10-09 DIAGNOSIS — Z23 Encounter for immunization: Secondary | ICD-10-CM

## 2017-10-09 DIAGNOSIS — L723 Sebaceous cyst: Secondary | ICD-10-CM

## 2017-10-09 DIAGNOSIS — I10 Essential (primary) hypertension: Secondary | ICD-10-CM

## 2017-10-09 DIAGNOSIS — J410 Simple chronic bronchitis: Secondary | ICD-10-CM | POA: Diagnosis not present

## 2017-10-09 MED ORDER — ALBUTEROL SULFATE HFA 108 (90 BASE) MCG/ACT IN AERS: 2.0000 | INHALATION_SPRAY | Freq: Four times a day (QID) | RESPIRATORY_TRACT | 4 refills | Status: AC | PRN

## 2017-10-09 NOTE — Progress Notes (Signed)
Subjective:    Patient ID: Derek Lopez, male    DOB: Mar 03, 1961, 56 y.o.   MRN: [id number]  HPI Hypertension- Pt denies chest pain, SOB, dizziness, or heart palpitations.  Taking meds as directed w/o problems.  Denies medication side effects.    He has had a few lesion on his face that have come up over time.  One drained recently on his left facial cheek. He said it is healing.    COPD -no recent flare exacerbation in symptoms.  He is doing well and using the Breo once daily every day.  Some occasional left shoulder pain after his fracture back in June but overall is doing okay.    He did want to get his first shingles vaccine today.   Review of Systems BP (!) 143/79   Pulse 66   Ht 5\' 11"  (1.803 m)   Wt 159 lb (72.1 kg)   SpO2 98%   BMI 22.18 kg/m     Allergies  Allergen Reactions  . Penicillins Rash    Past Medical History:  Diagnosis Date  . Cirrhosis ([place])   . Hypertension     Past Surgical History:  Procedure Laterality Date  . APPENDECTOMY    . ELECTROPHYSIOLOGIC STUDY N/A 09/29/2016   Procedure: A-Flutter;  Surgeon: Will [name], MD;  Location: [facility];  Service: Cardiovascular;  Laterality: N/A;  . TONSILLECTOMY      Social History   Socioeconomic History  . Marital status: Married    Spouse name: Not on file  . Number of children: 2  . Years of education: Not on file  . Highest education level: Not on file  Social Needs  . Financial resource strain: Not on file  . Food insecurity - worry: Not on file  . Food insecurity - inability: Not on file  . Transportation needs - medical: Not on file  . Transportation needs - non-medical: Not on file  Occupational History  . Occupation: Retired  Tobacco Use  . Smoking status: Current Every Day Smoker    Packs/day: 1.00    Types: Cigarettes  . Smokeless tobacco: Never Used  Substance and Sexual Activity  . Alcohol use: Yes    Alcohol/week: 29.4 Derek    Types: 49 Cans of beer per  week    Comment: 12 pack per day  . Drug use: No  . Sexual activity: Yes    Partners: Female  Other Topics Concern  . Not on file  Social History Narrative   Says walk daily.  .  Drink coffe in the AM, 2 cups.      Family History  Problem Relation Age of Onset  . Cancer Mother     Outpatient Encounter Medications as of 10/09/2017  Medication Sig  . albuterol (PROVENTIL HFA;VENTOLIN HFA) 108 (90 Base) MCG/ACT inhaler Inhale 2 puffs into the lungs every 6 (six) hours as needed for wheezing or shortness of breath.  Marland Kitchen b complex vitamins tablet Take 1 tablet by mouth daily.  Marland Kitchen BREO ELLIPTA 100-25 MCG/INH AEPB INHALE 1 PUFF INTO LUNGS ONCE DAILY  . lisinopril (PRINIVIL,ZESTRIL) 5 MG tablet Take 1 tablet (5 mg total) by mouth daily.  Marland Kitchen MAGNESIUM PO Take 1 tablet by mouth daily.  . metoprolol (TOPROL-XL) 200 MG 24 hr tablet TAKE ONE TABLET BY MOUTH AT BEDTIME TAKE  WITH  OR  IMMEDIATELY  FOLLOWING  A  MEAL  . tamsulosin (FLOMAX) 0.4 MG CAPS capsule Take 1 capsule (0.4 mg total)  by mouth daily.  . [DISCONTINUED] albuterol (PROVENTIL HFA;VENTOLIN HFA) 108 (90 Base) MCG/ACT inhaler Inhale 2 puffs into the lungs every 6 (six) hours as needed for wheezing or shortness of breath.   No facility-administered encounter medications on file as of 10/09/2017.          Objective:   Physical Exam  Constitutional: He is oriented to person, place, and time. He appears well-developed and well-nourished.  HENT:  Head: Normocephalic and atraumatic.  Cardiovascular: Normal rate, regular rhythm and normal heart sounds.  Pulmonary/Chest: Effort normal and breath sounds normal.  Neurological: He is alert and oriented to person, place, and time.  Skin: Skin is warm and dry.  He has a couple of raised lumps that are soft feeling on his face.  The one on his forehead feels a little bit more firm more like a sebaceous cyst.  One on the left has a little bit more crusty and irritated appearance.  That is when  he said drained earlier this week.  Psychiatric: He has a normal mood and affect. His behavior is normal.       Assessment & Plan:  HTN - Elevated today but at goal last OV. Will monitor.  Due for CMP and lipids.    COPD - doing well on Breo.  Continue to work on smoking cessation.  Though he is not interested in quitting at this time.  Tobacco abuse-not interested in quitting.  Sebaceous cyst-I think mostly what he has on his face or sebaceous cyst or inflamed sebum glands.  Right now is not really bothering him but if at some point he continues to get them more there painful or bothersome will be happy to refer him to dermatology at any point.  The one on the left facial cheek looks a little bit irritated and crusty but he said it recently drained.  I did encourage him to keep an eye on that.  If it does not resolve and it may need to be biopsied.

## 2017-10-22 ENCOUNTER — Other Ambulatory Visit: Payer: Self-pay | Admitting: *Deleted

## 2017-10-22 DIAGNOSIS — I10 Essential (primary) hypertension: Secondary | ICD-10-CM

## 2017-10-22 LAB — COMPLETE METABOLIC PANEL WITH GFR
AG Ratio: 1.4 (calc) (ref 1.0–2.5)
ALBUMIN MSPROF: 4.3 g/dL (ref 3.6–5.1)
ALKALINE PHOSPHATASE (APISO): 105 U/L (ref 40–115)
ALT: 36 U/L (ref 9–46)
AST: 78 U/L — AB (ref 10–35)
BILIRUBIN TOTAL: 1.5 mg/dL — AB (ref 0.2–1.2)
BUN / CREAT RATIO: 5 (calc) — AB (ref 6–22)
BUN: 3 mg/dL — AB (ref 7–25)
CHLORIDE: 96 mmol/L — AB (ref 98–110)
CO2: 24 mmol/L (ref 20–32)
Calcium: 9.5 mg/dL (ref 8.6–10.3)
Creat: 0.56 mg/dL — ABNORMAL LOW (ref 0.70–1.33)
GFR, Est African American: 134 mL/min/{1.73_m2} (ref >=60)
GFR, Est Non African American: 116 mL/min/{1.73_m2} (ref >=60)
GLOBULIN: 3 g/dL (ref 1.9–3.7)
GLUCOSE: 89 mg/dL (ref 65–99)
Potassium: 4.5 mmol/L (ref 3.5–5.3)
SODIUM: 131 mmol/L — AB (ref 135–146)
Total Protein: 7.3 g/dL (ref 6.1–8.1)

## 2017-10-22 LAB — LIPID PANEL W/REFLEX DIRECT LDL
CHOLESTEROL: 192 mg/dL (ref <200)
HDL: 86 mg/dL (ref >40)
LDL Cholesterol (Calc): 91 mg/dL (calc)
Non-HDL Cholesterol (Calc): 106 mg/dL (calc) (ref <130)
Total CHOL/HDL Ratio: 2.2 (calc) (ref <5.0)
Triglycerides: 66 mg/dL (ref <150)

## 2017-11-07 ENCOUNTER — Other Ambulatory Visit: Payer: Self-pay | Admitting: *Deleted

## 2017-11-07 MED ORDER — METOPROLOL SUCCINATE ER 200 MG PO TB24: ORAL_TABLET | ORAL | 1 refills | Status: DC

## 2017-11-07 MED ORDER — TAMSULOSIN HCL 0.4 MG PO CAPS: 0.4000 mg | ORAL_CAPSULE | Freq: Every day | ORAL | 1 refills | Status: DC

## 2017-11-07 MED ORDER — LISINOPRIL 5 MG PO TABS: 5.0000 mg | ORAL_TABLET | Freq: Every day | ORAL | 1 refills | Status: DC

## 2018-04-09 ENCOUNTER — Ambulatory Visit (INDEPENDENT_AMBULATORY_CARE_PROVIDER_SITE_OTHER): Payer: 59 | Admitting: Family Medicine

## 2018-04-09 ENCOUNTER — Encounter: Payer: Self-pay | Admitting: Family Medicine

## 2018-04-09 VITALS — BP 138/74 | HR 63 | Ht 71.0 in | Wt 161.0 lb

## 2018-04-09 DIAGNOSIS — Z23 Encounter for immunization: Secondary | ICD-10-CM

## 2018-04-09 DIAGNOSIS — Z114 Encounter for screening for human immunodeficiency virus [HIV]: Secondary | ICD-10-CM

## 2018-04-09 DIAGNOSIS — K7030 Alcoholic cirrhosis of liver without ascites: Secondary | ICD-10-CM | POA: Diagnosis not present

## 2018-04-09 DIAGNOSIS — Z0000 Encounter for general adult medical examination without abnormal findings: Secondary | ICD-10-CM | POA: Diagnosis not present

## 2018-04-09 NOTE — Progress Notes (Signed)
Subjective:    Patient ID: Derek Lopez, male    DOB: [birth year], 57 y.o.   MRN: [id number]  HPI 57 year old male comes in today for complete physical exam. He still continue to drink alcohol. He no longer wants to see GI and doesn't want any addition f/u unless starts to have symptoms.  He continues to consume alcohol daily with alcoholic cirrhosis and chronic hyponatremia from the cirrhosis.  He is not very physically active but does say that he gets up to take the dog out around the house.  He is not currently employed and has not been for years.   Review of Systems  Comprehensive review of systems is negative except for HPI.  BP 138/74   Pulse 63   Ht 5\' 11"  (1.803 m)   Wt 161 lb (73 kg)   SpO2 96%   BMI 22.45 kg/m     Allergies  Allergen Reactions  . Penicillins Rash    Past Medical History:  Diagnosis Date  . Cirrhosis ([place])   . Hypertension     Past Surgical History:  Procedure Laterality Date  . APPENDECTOMY    . ELECTROPHYSIOLOGIC STUDY N/A 09/29/2016   Procedure: A-Flutter;  Surgeon: Will [name], MD;  Location: [facility];  Service: Cardiovascular;  Laterality: N/A;  . TONSILLECTOMY      Social History   Socioeconomic History  . Marital status: Married    Spouse name: Not on file  . Number of children: 2  . Years of education: Not on file  . Highest education level: Not on file  Occupational History  . Occupation: Retired  Scientific laboratory technician  . Financial resource strain: Not on file  . Food insecurity:    Worry: Not on file    Inability: Not on file  . Transportation needs:    Medical: Not on file    Non-medical: Not on file  Tobacco Use  . Smoking status: Current Every Day Smoker    Packs/day: 1.00    Types: Cigarettes  . Smokeless tobacco: Never Used  Substance and Sexual Activity  . Alcohol use: Yes    Alcohol/week: 29.4 oz    Types: 49 Cans of beer per week    Comment: 12 pack per day  . Drug use: No  . Sexual activity:  Yes    Partners: Female  Lifestyle  . Physical activity:    Days per week: Not on file    Minutes per session: Not on file  . Stress: Not on file  Relationships  . Social connections:    Talks on phone: Not on file    Gets together: Not on file    Attends religious service: Not on file    Active member of club or organization: Not on file    Attends meetings of clubs or organizations: Not on file    Relationship status: Not on file  . Intimate partner violence:    Fear of current or ex partner: Not on file    Emotionally abused: Not on file    Physically abused: Not on file    Forced sexual activity: Not on file  Other Topics Concern  . Not on file  Social History Narrative   Says walk daily.  .  Drink coffe in the AM, 2 cups.      Family History  Problem Relation Age of Onset  . Cancer Mother     Outpatient Encounter Medications as of 04/09/2018  Medication Sig  .  albuterol (PROVENTIL HFA;VENTOLIN HFA) 108 (90 Base) MCG/ACT inhaler Inhale 2 puffs into the lungs every 6 (six) hours as needed for wheezing or shortness of breath.  Marland Kitchen b complex vitamins tablet Take 1 tablet by mouth daily.  Marland Kitchen BREO ELLIPTA 100-25 MCG/INH AEPB INHALE 1 PUFF INTO LUNGS ONCE DAILY  . lisinopril (PRINIVIL,ZESTRIL) 5 MG tablet Take 1 tablet (5 mg total) by mouth daily.  Marland Kitchen MAGNESIUM PO Take 1 tablet by mouth daily.  . metoprolol (TOPROL-XL) 200 MG 24 hr tablet TAKE ONE TABLET BY MOUTH AT BEDTIME TAKE  WITH  OR  IMMEDIATELY  FOLLOWING  A  MEAL  . tamsulosin (FLOMAX) 0.4 MG CAPS capsule Take 1 capsule (0.4 mg total) by mouth daily.   No facility-administered encounter medications on file as of 04/09/2018.           Objective:   Physical Exam  Constitutional: He is oriented to person, place, and time. He appears well-developed and well-nourished.  HENT:  Head: Normocephalic and atraumatic.  Right Ear: External ear normal.  Left Ear: External ear normal.  Nose: Nose normal.  Mouth/Throat:  Oropharynx is clear and moist.  Eyes: Pupils are equal, round, and reactive to light. Conjunctivae and EOM are normal.  Neck: Normal range of motion. Neck supple. No thyromegaly present.  Cardiovascular: Normal rate, regular rhythm, normal heart sounds and intact distal pulses.  Pulmonary/Chest: Effort normal and breath sounds normal.  Abdominal: Soft. Bowel sounds are normal. He exhibits no distension and no mass. There is no tenderness. There is no rebound and no guarding.  Musculoskeletal: Normal range of motion.  Lymphadenopathy:    He has no cervical adenopathy.  Neurological: He is alert and oriented to person, place, and time. He has normal reflexes.  Skin: Skin is warm and dry.  Psychiatric: He has a normal mood and affect. His behavior is normal. Judgment and thought content normal.       Assessment & Plan:  CPE Keep up a regular exercise program and make sure you are eating a healthy diet Try to eat 4 servings of dairy a day, or if you are lactose intolerant take a calcium with vitamin D daily.  Your vaccines are up to date.  Second shingles vaccine given today. Due for CMP.   Health Maintenance reviewed - screen Hep C  Alcoholic cirrhosis-he is not warranting any further work-up at this point so we discussed just monitoring for symptoms change such as retention of fluid, rapid weight gain or significant nausea.  He would certainly be at risk for progression of his cirrhosis from continued alcohol use in addition to bleeding varices.

## 2018-05-11 ENCOUNTER — Other Ambulatory Visit: Payer: Self-pay | Admitting: Family Medicine

## 2018-05-20 ENCOUNTER — Other Ambulatory Visit: Payer: Self-pay | Admitting: Family Medicine

## 2018-06-04 ENCOUNTER — Other Ambulatory Visit: Payer: Self-pay | Admitting: Family Medicine

## 2018-07-23 LAB — COMPLETE METABOLIC PANEL WITH GFR
AG Ratio: 1.6 (calc) (ref 1.0–2.5)
ALT: 33 U/L (ref 9–46)
AST: 67 U/L — AB (ref 10–35)
Albumin: 4.6 g/dL (ref 3.6–5.1)
Alkaline phosphatase (APISO): 103 U/L (ref 40–115)
BILIRUBIN TOTAL: 1.1 mg/dL (ref 0.2–1.2)
BUN/Creatinine Ratio: 7 (calc) (ref 6–22)
BUN: 4 mg/dL — ABNORMAL LOW (ref 7–25)
CHLORIDE: 90 mmol/L — AB (ref 98–110)
CO2: 28 mmol/L (ref 20–32)
Calcium: 9.8 mg/dL (ref 8.6–10.3)
Creat: 0.58 mg/dL — ABNORMAL LOW (ref 0.70–1.33)
GFR, EST AFRICAN AMERICAN: 131 mL/min/{1.73_m2} (ref >=60)
GFR, Est Non African American: 113 mL/min/{1.73_m2} (ref >=60)
Globulin: 2.8 g/dL (calc) (ref 1.9–3.7)
Glucose, Bld: 92 mg/dL (ref 65–99)
POTASSIUM: 4.3 mmol/L (ref 3.5–5.3)
SODIUM: 125 mmol/L — AB (ref 135–146)
TOTAL PROTEIN: 7.4 g/dL (ref 6.1–8.1)

## 2018-07-23 LAB — HEPATITIS C ANTIBODY
Hepatitis C Ab: NONREACTIVE
SIGNAL TO CUT-OFF: 0.02 (ref <1.00)

## 2018-10-09 ENCOUNTER — Ambulatory Visit: Payer: 59 | Admitting: Family Medicine

## 2018-10-09 ENCOUNTER — Encounter: Payer: Self-pay | Admitting: Family Medicine

## 2018-10-09 VITALS — BP 132/79 | HR 61 | Ht 71.0 in | Wt 154.0 lb

## 2018-10-09 DIAGNOSIS — I10 Essential (primary) hypertension: Secondary | ICD-10-CM | POA: Diagnosis not present

## 2018-10-09 DIAGNOSIS — K7030 Alcoholic cirrhosis of liver without ascites: Secondary | ICD-10-CM

## 2018-10-09 DIAGNOSIS — J410 Simple chronic bronchitis: Secondary | ICD-10-CM | POA: Diagnosis not present

## 2018-10-09 DIAGNOSIS — Z125 Encounter for screening for malignant neoplasm of prostate: Secondary | ICD-10-CM | POA: Diagnosis not present

## 2018-10-09 DIAGNOSIS — Z720 Tobacco use: Secondary | ICD-10-CM

## 2018-10-09 NOTE — Progress Notes (Signed)
Subjective:    CC: HTN and cirrhosis  HPI:  Hypertension- Pt denies chest pain, SOB, dizziness, or heart palpitations.  Taking meds as directed w/o problems.  Denies medication side effects.    F/U COPD - He is doing well. He hasn't had any exacerbations since I last saw him.    Follow-up alcoholic cirrhosis of the liver- still drinking daily.  He is no longer following with GI as he prefers not to.  He is planning on continuing drinking and does not want any aggressive treatments.  Tob abuse - he is not interested in quitting.    Past medical history, Surgical history, Family history not pertinant except as noted below, Social history, Allergies, and medications have been entered into the medical record, reviewed, and corrections made.   Review of Systems: No fevers, chills, night sweats, weight loss, chest pain, or shortness of breath.   Objective:    General: Well Developed, well nourished, and in no acute distress.  Neuro: Alert and oriented x3, extra-ocular muscles intact, sensation grossly intact.  HEENT: Normocephalic, atraumatic  Skin: Warm and dry, no rashes. Cardiac: Regular rate and rhythm, no murmurs rubs or gallops, no lower extremity edema.  Respiratory: Clear to auscultation bilaterally. Not using accessory muscles, speaking in full sentences.   Impression and Recommendations:    HTN - BP borderline. Continue current regimen. Follow up in 6 months.    COPD - STable. No recent flares.   Alcoholic cirrhosis of the liver- recheck liver enzymes.  Encourage alcohol cessation.  Also will schedule for ultrasound abdomen with elastography to just see we were at as far as his liver is concerned.  May need more advanced imaging and not what we find.  Tob abuse - not interested in quitting.

## 2018-10-10 LAB — LIPID PANEL
CHOL/HDL RATIO: 2.3 (calc) (ref <5.0)
CHOLESTEROL: 162 mg/dL (ref <200)
HDL: 69 mg/dL (ref >40)
LDL Cholesterol (Calc): 80 mg/dL (calc)
Non-HDL Cholesterol (Calc): 93 mg/dL (calc) (ref <130)
Triglycerides: 51 mg/dL (ref <150)

## 2018-10-10 LAB — COMPLETE METABOLIC PANEL WITH GFR
AG Ratio: 1.4 (calc) (ref 1.0–2.5)
ALT: 18 U/L (ref 9–46)
AST: 36 U/L — AB (ref 10–35)
Albumin: 4 g/dL (ref 3.6–5.1)
Alkaline phosphatase (APISO): 119 U/L — ABNORMAL HIGH (ref 40–115)
BUN/Creatinine Ratio: 5 (calc) — ABNORMAL LOW (ref 6–22)
BUN: 3 mg/dL — AB (ref 7–25)
CALCIUM: 9.2 mg/dL (ref 8.6–10.3)
CO2: 25 mmol/L (ref 20–32)
CREATININE: 0.59 mg/dL — AB (ref 0.70–1.33)
Chloride: 94 mmol/L — ABNORMAL LOW (ref 98–110)
GFR, EST NON AFRICAN AMERICAN: 112 mL/min/{1.73_m2} (ref >=60)
GFR, Est African American: 130 mL/min/{1.73_m2} (ref >=60)
GLOBULIN: 2.8 g/dL (ref 1.9–3.7)
Glucose, Bld: 83 mg/dL (ref 65–99)
Potassium: 4.2 mmol/L (ref 3.5–5.3)
SODIUM: 127 mmol/L — AB (ref 135–146)
Total Bilirubin: 0.9 mg/dL (ref 0.2–1.2)
Total Protein: 6.8 g/dL (ref 6.1–8.1)

## 2018-10-10 LAB — PSA: PSA: 1.4 ng/mL (ref <=4.0)

## 2018-11-20 ENCOUNTER — Other Ambulatory Visit: Payer: Self-pay | Admitting: Family Medicine

## 2018-12-08 ENCOUNTER — Other Ambulatory Visit: Payer: Self-pay | Admitting: Family Medicine

## 2019-01-02 ENCOUNTER — Other Ambulatory Visit: Payer: Self-pay | Admitting: Family Medicine

## 2019-01-11 ENCOUNTER — Other Ambulatory Visit: Payer: Self-pay | Admitting: Family Medicine

## 2019-02-21 ENCOUNTER — Other Ambulatory Visit: Payer: Self-pay | Admitting: Family Medicine

## 2019-04-07 ENCOUNTER — Other Ambulatory Visit: Payer: Self-pay | Admitting: Family Medicine

## 2019-04-09 ENCOUNTER — Encounter: Payer: 59 | Admitting: Family Medicine

## 2019-04-16 ENCOUNTER — Encounter: Payer: Self-pay | Admitting: Family Medicine

## 2019-04-16 ENCOUNTER — Ambulatory Visit (INDEPENDENT_AMBULATORY_CARE_PROVIDER_SITE_OTHER): Payer: 59 | Admitting: Family Medicine

## 2019-04-16 VITALS — BP 142/84 | HR 67 | Ht 71.0 in | Wt 146.0 lb

## 2019-04-16 DIAGNOSIS — I700 Atherosclerosis of aorta: Secondary | ICD-10-CM

## 2019-04-16 DIAGNOSIS — K7030 Alcoholic cirrhosis of liver without ascites: Secondary | ICD-10-CM

## 2019-04-16 DIAGNOSIS — J410 Simple chronic bronchitis: Secondary | ICD-10-CM

## 2019-04-16 DIAGNOSIS — I10 Essential (primary) hypertension: Secondary | ICD-10-CM | POA: Diagnosis not present

## 2019-04-16 DIAGNOSIS — Z0000 Encounter for general adult medical examination without abnormal findings: Secondary | ICD-10-CM | POA: Diagnosis not present

## 2019-04-16 NOTE — Assessment & Plan Note (Signed)
Asymptomatic. Not on a statin.

## 2019-04-16 NOTE — Assessment & Plan Note (Addendum)
Continue to monitor liver enzymes. Needs repeat imaging. Had ordered US in December but patient never went.

## 2019-04-16 NOTE — Progress Notes (Signed)
Established Patient Office Visit- CPE  Subjective:  Patient ID: Derek Lopez, male    DOB: 1961-04-12  Age: 58 y.o. MRN: [id number]  CC:  Chief Complaint  Patient presents with  . Annual Exam    HPI Derek Lopez presents for CPE. He is still drinking alcohol heavily.  He walks around the yard some but no formal exercise.  He is also here for 6 months follow up on chronic medical problems.    Hypertension- Pt denies chest pain, SOB, dizziness, or heart palpitations.  Taking meds as directed w/o problems.  Denies medication side effects.    F/U atheroslcerosis of the aorta - Not on a stain. No recent CP.   F/U COPD - Using his Breo daily.  onlu uses albuterol about once a week.   Cirrhosis - no abd pain, swelling or bloating.  Still drinking heavily.  12 pack per day.  He has refused to follow with GI.    Past Medical History:  Diagnosis Date  . Cirrhosis ([place])   . Hypertension     Past Surgical History:  Procedure Laterality Date  . APPENDECTOMY    . ELECTROPHYSIOLOGIC STUDY N/A 09/29/2016   Procedure: A-Flutter;  Surgeon: Will [name], MD;  Location: [facility];  Service: Cardiovascular;  Laterality: N/A;  . TONSILLECTOMY      Family History  Problem Relation Age of Onset  . Cancer Mother     Social History   Socioeconomic History  . Marital status: Married    Spouse name: Not on file  . Number of children: 2  . Years of education: Not on file  . Highest education level: Not on file  Occupational History  . Occupation: Retired  Scientific laboratory technician  . Financial resource strain: Not on file  . Food insecurity    Worry: Not on file    Inability: Not on file  . Transportation needs    Medical: Not on file    Non-medical: Not on file  Tobacco Use  . Smoking status: Current Every Day Smoker    Packs/day: 1.00    Types: Cigarettes  . Smokeless tobacco: Never Used  Substance and Sexual Activity  . Alcohol use: Yes    Alcohol/week: 49.0 standard  drinks    Types: 49 Cans of beer per week    Comment: 12 pack per day  . Drug use: No  . Sexual activity: Yes    Partners: Female  Lifestyle  . Physical activity    Days per week: Not on file    Minutes per session: Not on file  . Stress: Not on file  Relationships  . Social Herbalist on phone: Not on file    Gets together: Not on file    Attends religious service: Not on file    Active member of club or organization: Not on file    Attends meetings of clubs or organizations: Not on file    Relationship status: Not on file  . Intimate partner violence    Fear of current or ex partner: Not on file    Emotionally abused: Not on file    Physically abused: Not on file    Forced sexual activity: Not on file  Other Topics Concern  . Not on file  Social History Narrative   Says walk daily.  .  Drink coffe in the AM, 2 cups.      Outpatient Medications Prior to Visit  Medication Sig Dispense Refill  .  albuterol (PROVENTIL HFA;VENTOLIN HFA) 108 (90 Base) MCG/ACT inhaler Inhale 2 puffs into the lungs every 6 (six) hours as needed for wheezing or shortness of breath. 1 Inhaler 4  . b complex vitamins tablet Take 1 tablet by mouth daily.    Marland Kitchen BREO ELLIPTA 100-25 MCG/INH AEPB Inhale 1 puff by mouth once daily 60 each 6  . lisinopril (ZESTRIL) 5 MG tablet Take 1 tablet by mouth once daily 90 tablet 1  . MAGNESIUM PO Take 1 tablet by mouth daily.    . metoprolol (TOPROL-XL) 200 MG 24 hr tablet TAKE 1 TABLET BY MOUTH AT BEDTIME WITH OR IMMEDIATELY FOLLOWING A MEAL 90 tablet 0  . tamsulosin (FLOMAX) 0.4 MG CAPS capsule Take 1 capsule by mouth once daily 30 capsule 6   No facility-administered medications prior to visit.     Allergies  Allergen Reactions  . Penicillins Rash    ROS Review of Systems    Objective:    Physical Exam  Constitutional: He is oriented to person, place, and time. He appears well-developed and well-nourished.  HENT:  Head: Normocephalic and  atraumatic.  Right Ear: External ear normal.  Left Ear: External ear normal.  Nose: Nose normal.  Mouth/Throat: Oropharynx is clear and moist.  Eyes: Pupils are equal, round, and reactive to light. Conjunctivae and EOM are normal.  Neck: Normal range of motion. Neck supple. No thyromegaly present.  Cardiovascular: Normal rate, regular rhythm, normal heart sounds and intact distal pulses.  Pulmonary/Chest: Effort normal and breath sounds normal.  Abdominal: Soft. Bowel sounds are normal. He exhibits no distension and no mass. There is no abdominal tenderness. There is no rebound and no guarding.  Musculoskeletal: Normal range of motion.  Lymphadenopathy:    He has no cervical adenopathy.  Neurological: He is alert and oriented to person, place, and time. He has normal reflexes.  Skin: Skin is warm and dry.  Psychiatric: He has a normal mood and affect. His behavior is normal. Judgment and thought content normal.    BP (!) 142/84   Pulse 67   Ht 5\' 11"  (1.803 m)   Wt 146 lb (66.2 kg)   SpO2 100%   BMI 20.36 kg/m  Wt Readings from Last 3 Encounters:  04/16/19 146 lb (66.2 kg)  10/09/18 154 lb (69.9 kg)  04/09/18 161 lb (73 kg)     Health Maintenance Due  Topic Date Due  . HIV Screening  01/18/1976  . Fecal DNA (Cologuard)  02/03/2019    There are no preventive care reminders to display for this patient.  Lab Results  Component Value Date   TSH 2.11 03/24/2016   Lab Results  Component Value Date   WBC 9.0 12/14/2016   HGB 15.5 12/14/2016   HCT 42.8 12/14/2016   MCV 98.2 12/14/2016   PLT 128 (L) 12/14/2016   Lab Results  Component Value Date   NA 127 (L) 10/09/2018   K 4.2 10/09/2018   CO2 25 10/09/2018   GLUCOSE 83 10/09/2018   BUN 3 (L) 10/09/2018   CREATININE 0.59 (L) 10/09/2018   BILITOT 0.9 10/09/2018   ALKPHOS 98 04/20/2017   AST 36 (H) 10/09/2018   ALT 18 10/09/2018   PROT 6.8 10/09/2018   ALBUMIN 4.2 04/20/2017   CALCIUM 9.2 10/09/2018   Lab  Results  Component Value Date   CHOL 162 10/09/2018   Lab Results  Component Value Date   HDL 69 10/09/2018   Lab Results  Component Value Date  [place] 80 10/09/2018   Lab Results  Component Value Date   TRIG 51 10/09/2018   Lab Results  Component Value Date   CHOLHDL 2.3 10/09/2018   Lab Results  Component Value Date   HGBA1C 5.1 12/14/2016      Assessment & Plan:   Problem List Items Addressed This Visit      Cardiovascular and Mediastinum   Essential hypertension, benign    BP was a little high today. Will monitor. Make sure taking med daily       Relevant Orders   COMPLETE METABOLIC PANEL WITH GFR   Atherosclerosis of aorta ([facility])    Asymptomatic. Not on a statin.         Respiratory   Simple chronic bronchitis ([facility])    Stable, no recent flare.         Digestive   Alcoholic cirrhosis of liver without ascites ([place])    Continue to monitor liver enzymes. Needs repeat imaging. Had ordered US in December but patient never went.       Relevant Orders   COMPLETE METABOLIC PANEL WITH GFR    Other Visit Diagnoses    Wellness examination    -  Primary     Keep up a regular exercise program and make sure you are eating a healthy diet Try to eat 4 servings of dairy a day, or if you are lactose intolerant take a calcium with vitamin D daily.  Your vaccines are up to date.    No orders of the defined types were placed in this encounter.   Follow-up: Return in about 6 months (around 10/16/2019), or liver and BP check up.    [name], MD

## 2019-04-16 NOTE — Assessment & Plan Note (Signed)
BP was a little high today. Will monitor. Make sure taking med daily

## 2019-04-16 NOTE — Patient Instructions (Signed)

## 2019-04-16 NOTE — Assessment & Plan Note (Signed)
Stable, no recent flare 

## 2019-05-26 ENCOUNTER — Encounter: Payer: Self-pay | Admitting: Family Medicine

## 2019-09-24 ENCOUNTER — Other Ambulatory Visit: Payer: Self-pay | Admitting: Family Medicine

## 2019-09-26 ENCOUNTER — Other Ambulatory Visit: Payer: Self-pay | Admitting: Family Medicine

## 2019-10-13 ENCOUNTER — Encounter: Payer: Self-pay | Admitting: Family Medicine

## 2019-10-13 ENCOUNTER — Other Ambulatory Visit: Payer: Self-pay

## 2019-10-13 ENCOUNTER — Ambulatory Visit: Payer: 59 | Admitting: Family Medicine

## 2019-10-13 VITALS — BP 122/77 | HR 67 | Ht 71.0 in | Wt 156.0 lb

## 2019-10-13 DIAGNOSIS — J410 Simple chronic bronchitis: Secondary | ICD-10-CM

## 2019-10-13 DIAGNOSIS — I10 Essential (primary) hypertension: Secondary | ICD-10-CM

## 2019-10-13 DIAGNOSIS — R030 Elevated blood-pressure reading, without diagnosis of hypertension: Secondary | ICD-10-CM | POA: Diagnosis not present

## 2019-10-13 DIAGNOSIS — IMO0001 Reserved for inherently not codable concepts without codable children: Secondary | ICD-10-CM

## 2019-10-13 DIAGNOSIS — Z125 Encounter for screening for malignant neoplasm of prostate: Secondary | ICD-10-CM

## 2019-10-13 DIAGNOSIS — K7030 Alcoholic cirrhosis of liver without ascites: Secondary | ICD-10-CM

## 2019-10-13 DIAGNOSIS — N401 Enlarged prostate with lower urinary tract symptoms: Secondary | ICD-10-CM

## 2019-10-13 DIAGNOSIS — F1020 Alcohol dependence, uncomplicated: Secondary | ICD-10-CM

## 2019-10-13 NOTE — Assessment & Plan Note (Signed)
Continue current regimen with Breo.  Follow-up in 6 months.

## 2019-10-13 NOTE — Assessment & Plan Note (Signed)
As he is only drinking 6 beers per day currently.  Continue to work on cessation.

## 2019-10-13 NOTE — Progress Notes (Signed)
Established Patient Office Visit  Subjective:  Patient ID: Derek Lopez, male    DOB: [birth year]  Age: 58 y.o. MRN: [id number]  CC:  Chief Complaint  Patient presents with  . Hypertension    HPI Derek Lopez presents for   Hypertension- Pt denies chest pain, SOB, dizziness, or heart palpitations.  Taking meds as directed w/o problems.  Denies medication side effects.    F/U BPH - he is doing well on his flomax.  Feels like he is doing well on the medication    Alcoholic cirrhosis-he still drinks a for screening PSA.  Bout 6 beers per day currently.  He has not had any swelling or change in mentation.  He is overdue for lab work but says he plans to go tomorrow.  COPD-does wake up with a slight cough in the mornings but otherwise feels like he is doing well.  He uses Breo.  No recent flares or exacerbations.  Past Medical History:  Diagnosis Date  . Cirrhosis ([place])   . Hypertension     Past Surgical History:  Procedure Laterality Date  . APPENDECTOMY    . ELECTROPHYSIOLOGIC STUDY N/A 09/29/2016   Procedure: A-Flutter;  Surgeon: Will [name], MD;  Location: [facility];  Service: Cardiovascular;  Laterality: N/A;  . TONSILLECTOMY      Family History  Problem Relation Age of Onset  . Cancer Mother     Social History   Socioeconomic History  . Marital status: Married    Spouse name: Not on file  . Number of children: 2  . Years of education: Not on file  . Highest education level: Not on file  Occupational History  . Occupation: Retired  Tobacco Use  . Smoking status: Current Every Day Smoker    Packs/day: 1.00    Types: Cigarettes  . Smokeless tobacco: Never Used  Substance and Sexual Activity  . Alcohol use: Yes    Alcohol/week: 49.0 standard drinks    Types: 49 Cans of beer per week    Comment: 12 pack per day  . Drug use: No  . Sexual activity: Yes    Partners: Female  Other Topics Concern  . Not on file  Social History Narrative    Says walk daily.  .  Drink coffe in the AM, 2 cups.     Social Determinants of Health   Financial Resource Strain:   . Difficulty of Paying Living Expenses: Not on file  Food Insecurity:   . Worried About Charity fundraiser in the Last Year: Not on file  . Ran Out of Food in the Last Year: Not on file  Transportation Needs:   . Lack of Transportation (Medical): Not on file  . Lack of Transportation (Non-Medical): Not on file  Physical Activity:   . Days of Exercise per Week: Not on file  . Minutes of Exercise per Session: Not on file  Stress:   . Feeling of Stress : Not on file  Social Connections:   . Frequency of Communication with Friends and Family: Not on file  . Frequency of Social Gatherings with Friends and Family: Not on file  . Attends Religious Services: Not on file  . Active Member of Clubs or Organizations: Not on file  . Attends Archivist Meetings: Not on file  . Marital Status: Not on file  Intimate Partner Violence:   . Fear of Current or Ex-Partner: Not on file  . Emotionally Abused: Not on  file  . Physically Abused: Not on file  . Sexually Abused: Not on file    Outpatient Medications Prior to Visit  Medication Sig Dispense Refill  . albuterol (PROVENTIL HFA;VENTOLIN HFA) 108 (90 Base) MCG/ACT inhaler Inhale 2 puffs into the lungs every 6 (six) hours as needed for wheezing or shortness of breath. 1 Inhaler 4  . b complex vitamins tablet Take 1 tablet by mouth daily.    Marland Kitchen BREO ELLIPTA 100-25 MCG/INH AEPB Inhale 1 puff by mouth once daily 60 each 0  . lisinopril (ZESTRIL) 5 MG tablet Take 1 tablet by mouth once daily 90 tablet 1  . MAGNESIUM PO Take 1 tablet by mouth daily.    . metoprolol (TOPROL-XL) 200 MG 24 hr tablet TAKE 1 TABLET BY MOUTH AT BEDTIME WITH OR IMMEDIATELY FOLLOWING A MEAL 90 tablet 1  . tamsulosin (FLOMAX) 0.4 MG CAPS capsule Take 1 capsule by mouth once daily 30 capsule 6   No facility-administered medications prior to visit.     Allergies  Allergen Reactions  . Penicillins Rash    ROS Review of Systems    Objective:    Physical Exam  Constitutional: He is oriented to person, place, and time. He appears well-developed and well-nourished.  HENT:  Head: Normocephalic and atraumatic.  Cardiovascular: Normal rate, regular rhythm and normal heart sounds.  Pulmonary/Chest: Effort normal and breath sounds normal.  Neurological: He is alert and oriented to person, place, and time.  Skin: Skin is warm and dry.  Psychiatric: He has a normal mood and affect. His behavior is normal.    BP 122/77   Pulse 67   Ht 5\' 11"  (1.803 m)   Wt 156 lb (70.8 kg)   SpO2 100%   BMI 21.76 kg/m  Wt Readings from Last 3 Encounters:  10/13/19 156 lb (70.8 kg)  04/16/19 146 lb (66.2 kg)  10/09/18 154 lb (69.9 kg)     Health Maintenance Due  Topic Date Due  . HIV Screening  01/18/1976  . COLONOSCOPY  03/14/2018    There are no preventive care reminders to display for this patient.  Lab Results  Component Value Date   TSH 2.11 03/24/2016   Lab Results  Component Value Date   WBC 9.0 12/14/2016   HGB 15.5 12/14/2016   HCT 42.8 12/14/2016   MCV 98.2 12/14/2016   PLT 128 (L) 12/14/2016   Lab Results  Component Value Date   NA 127 (L) 10/09/2018   K 4.2 10/09/2018   CO2 25 10/09/2018   GLUCOSE 83 10/09/2018   BUN 3 (L) 10/09/2018   CREATININE 0.59 (L) 10/09/2018   BILITOT 0.9 10/09/2018   ALKPHOS 98 04/20/2017   AST 36 (H) 10/09/2018   ALT 18 10/09/2018   PROT 6.8 10/09/2018   ALBUMIN 4.2 04/20/2017   CALCIUM 9.2 10/09/2018   Lab Results  Component Value Date   CHOL 162 10/09/2018   Lab Results  Component Value Date   HDL 69 10/09/2018   Lab Results  Component Value Date   LDLCALC 80 10/09/2018   Lab Results  Component Value Date   TRIG 51 10/09/2018   Lab Results  Component Value Date   CHOLHDL 2.3 10/09/2018   Lab Results  Component Value Date   HGBA1C 5.1 12/14/2016       Assessment & Plan:   Problem List Items Addressed This Visit      Cardiovascular and Mediastinum   Essential hypertension, benign - Primary  Blood pressure looks fantastic today.  Much better than when he was last here 6 months ago.  Continue with metoprolol.  Due for updated blood work.      Relevant Orders   Lipid Panel w/reflex Direct LDL   COMPLETE METABOLIC PANEL WITH GFR   CBC   PSA     Respiratory   Simple chronic bronchitis (HCC)    Continue current regimen with Breo.  Follow-up in 6 months.        Digestive   Alcoholic cirrhosis of liver without ascites ([place])    Encourage cessation of alcohol.  Again we will check liver enzymes.      Relevant Orders   Lipid Panel w/reflex Direct LDL   COMPLETE METABOLIC PANEL WITH GFR   CBC   PSA     Genitourinary   BPH (benign prostatic hyperplasia)    Continue flomax.          Other   Alcoholism /alcohol abuse ([place])    As he is only drinking 6 beers per day currently.  Continue to work on cessation.      ABNORMAL FINDINGS, ELEVATED BP W/O HTN    Other Visit Diagnoses    Screening for prostate cancer       Relevant Orders   PSA      No orders of the defined types were placed in this encounter.   Follow-up: Return in about 6 months (around 04/12/2020) for Medications and liver check up.     [name], MD

## 2019-10-13 NOTE — Assessment & Plan Note (Signed)
Encourage cessation of alcohol.  Again we will check liver enzymes.

## 2019-10-13 NOTE — Assessment & Plan Note (Signed)
Blood pressure looks fantastic today.  Much better than when he was last here 6 months ago.  Continue with metoprolol.  Due for updated blood work.

## 2019-10-13 NOTE — Assessment & Plan Note (Signed)
Continue flomax  

## 2019-10-15 LAB — COMPLETE METABOLIC PANEL WITH GFR
AG Ratio: 1.7 (calc) (ref 1.0–2.5)
ALT: 14 U/L (ref 9–46)
AST: 21 U/L (ref 10–35)
Albumin: 3.9 g/dL (ref 3.6–5.1)
Alkaline phosphatase (APISO): 104 U/L (ref 35–144)
BUN/Creatinine Ratio: 7 (calc) (ref 6–22)
BUN: 4 mg/dL — ABNORMAL LOW (ref 7–25)
CO2: 25 mmol/L (ref 20–32)
Calcium: 9 mg/dL (ref 8.6–10.3)
Chloride: 94 mmol/L — ABNORMAL LOW (ref 98–110)
Creat: 0.61 mg/dL — ABNORMAL LOW (ref 0.70–1.33)
GFR, Est African American: 128 mL/min/{1.73_m2} (ref >=60)
GFR, Est Non African American: 110 mL/min/{1.73_m2} (ref >=60)
Globulin: 2.3 g/dL (calc) (ref 1.9–3.7)
Glucose, Bld: 90 mg/dL (ref 65–99)
Potassium: 4.5 mmol/L (ref 3.5–5.3)
Sodium: 128 mmol/L — ABNORMAL LOW (ref 135–146)
Total Bilirubin: 0.6 mg/dL (ref 0.2–1.2)
Total Protein: 6.2 g/dL (ref 6.1–8.1)

## 2019-10-15 LAB — CBC
HCT: 40.5 % (ref 38.5–50.0)
Hemoglobin: 14.6 g/dL (ref 13.2–17.1)
MCH: 35.8 pg — ABNORMAL HIGH (ref 27.0–33.0)
MCHC: 36 g/dL (ref 32.0–36.0)
MCV: 99.3 fL (ref 80.0–100.0)
MPV: 9.4 fL (ref 7.5–12.5)
Platelets: 303 10*3/uL (ref 140–400)
RBC: 4.08 10*6/uL — ABNORMAL LOW (ref 4.20–5.80)
RDW: 11.9 % (ref 11.0–15.0)
WBC: 5.2 10*3/uL (ref 3.8–10.8)

## 2019-10-15 LAB — LIPID PANEL W/REFLEX DIRECT LDL
Cholesterol: 163 mg/dL (ref <200)
HDL: 45 mg/dL (ref >=40)
LDL Cholesterol (Calc): 104 mg/dL (calc) — ABNORMAL HIGH
Non-HDL Cholesterol (Calc): 118 mg/dL (calc) (ref <130)
Total CHOL/HDL Ratio: 3.6 (calc) (ref <5.0)
Triglycerides: 59 mg/dL (ref <150)

## 2019-10-15 LAB — PSA: PSA: 0.5 ng/mL (ref <=4.0)

## 2019-10-29 ENCOUNTER — Other Ambulatory Visit: Payer: Self-pay | Admitting: Family Medicine

## 2019-11-11 ENCOUNTER — Other Ambulatory Visit: Payer: Self-pay | Admitting: Family Medicine

## 2020-01-29 ENCOUNTER — Other Ambulatory Visit: Payer: Self-pay | Admitting: Family Medicine

## 2020-02-21 ENCOUNTER — Other Ambulatory Visit: Payer: Self-pay | Admitting: Family Medicine

## 2020-04-12 ENCOUNTER — Ambulatory Visit: Payer: Self-pay | Admitting: Family Medicine

## 2020-05-09 ENCOUNTER — Other Ambulatory Visit: Payer: Self-pay | Admitting: Family Medicine

## 2020-05-20 ENCOUNTER — Encounter: Payer: Self-pay | Admitting: Family Medicine
